# Patient Record
Sex: Female | Born: 1945
Health system: Southern US, Community
[De-identification: ages and names within clinical notes are randomized; demographics above are authoritative.]

## PROBLEM LIST (undated history)

## (undated) DIAGNOSIS — R053 Chronic cough: Secondary | ICD-10-CM

## (undated) DIAGNOSIS — Z973 Presence of spectacles and contact lenses: Secondary | ICD-10-CM

## (undated) DIAGNOSIS — R05 Cough: Secondary | ICD-10-CM

## (undated) DIAGNOSIS — Z789 Other specified health status: Secondary | ICD-10-CM

## (undated) DIAGNOSIS — I10 Essential (primary) hypertension: Secondary | ICD-10-CM

## (undated) HISTORY — PX: TUBAL LIGATION: SHX77

## (undated) HISTORY — PX: COLONOSCOPY: SHX174

---

## 1998-08-24 ENCOUNTER — Ambulatory Visit (HOSPITAL_COMMUNITY): Admission: RE | Admit: 1998-08-24 | Discharge: 1998-08-24 | Payer: Self-pay | Admitting: Obstetrics and Gynecology

## 1998-08-24 ENCOUNTER — Encounter: Payer: Self-pay | Admitting: Obstetrics and Gynecology

## 1999-09-19 ENCOUNTER — Encounter: Payer: Self-pay | Admitting: Obstetrics and Gynecology

## 1999-09-19 ENCOUNTER — Encounter: Admission: RE | Admit: 1999-09-19 | Discharge: 1999-09-19 | Payer: Self-pay | Admitting: Obstetrics and Gynecology

## 1999-09-27 ENCOUNTER — Other Ambulatory Visit: Admission: RE | Admit: 1999-09-27 | Discharge: 1999-09-27 | Payer: Self-pay | Admitting: Obstetrics and Gynecology

## 1999-09-30 ENCOUNTER — Encounter: Payer: Self-pay | Admitting: Obstetrics and Gynecology

## 1999-09-30 ENCOUNTER — Ambulatory Visit (HOSPITAL_COMMUNITY): Admission: RE | Admit: 1999-09-30 | Discharge: 1999-09-30 | Payer: Self-pay | Admitting: Obstetrics and Gynecology

## 2000-06-11 ENCOUNTER — Ambulatory Visit (HOSPITAL_COMMUNITY): Admission: RE | Admit: 2000-06-11 | Discharge: 2000-06-11 | Payer: Self-pay | Admitting: Obstetrics and Gynecology

## 2000-06-11 ENCOUNTER — Encounter: Payer: Self-pay | Admitting: Obstetrics and Gynecology

## 2000-09-21 ENCOUNTER — Encounter: Admission: RE | Admit: 2000-09-21 | Discharge: 2000-09-21 | Payer: Self-pay | Admitting: Obstetrics and Gynecology

## 2000-09-21 ENCOUNTER — Encounter: Payer: Self-pay | Admitting: Obstetrics and Gynecology

## 2001-03-05 ENCOUNTER — Other Ambulatory Visit: Admission: RE | Admit: 2001-03-05 | Discharge: 2001-03-05 | Payer: Self-pay | Admitting: Obstetrics and Gynecology

## 2001-03-08 ENCOUNTER — Ambulatory Visit (HOSPITAL_COMMUNITY): Admission: RE | Admit: 2001-03-08 | Discharge: 2001-03-08 | Payer: Self-pay | Admitting: Obstetrics and Gynecology

## 2001-03-08 ENCOUNTER — Encounter: Payer: Self-pay | Admitting: Obstetrics and Gynecology

## 2001-04-05 ENCOUNTER — Ambulatory Visit (HOSPITAL_COMMUNITY): Admission: RE | Admit: 2001-04-05 | Discharge: 2001-04-05 | Payer: Self-pay | Admitting: Family Medicine

## 2001-04-05 ENCOUNTER — Encounter: Payer: Self-pay | Admitting: Family Medicine

## 2001-04-14 ENCOUNTER — Ambulatory Visit (HOSPITAL_COMMUNITY): Admission: RE | Admit: 2001-04-14 | Discharge: 2001-04-14 | Payer: Self-pay | Admitting: Family Medicine

## 2001-04-14 ENCOUNTER — Encounter (INDEPENDENT_AMBULATORY_CARE_PROVIDER_SITE_OTHER): Payer: Self-pay | Admitting: Specialist

## 2001-04-14 ENCOUNTER — Encounter: Payer: Self-pay | Admitting: Family Medicine

## 2001-09-23 ENCOUNTER — Encounter: Admission: RE | Admit: 2001-09-23 | Discharge: 2001-09-23 | Payer: Self-pay | Admitting: Obstetrics and Gynecology

## 2001-09-23 ENCOUNTER — Encounter: Payer: Self-pay | Admitting: Obstetrics and Gynecology

## 2002-09-26 ENCOUNTER — Encounter: Admission: RE | Admit: 2002-09-26 | Discharge: 2002-09-26 | Payer: Self-pay | Admitting: Obstetrics and Gynecology

## 2002-09-26 ENCOUNTER — Encounter: Payer: Self-pay | Admitting: Obstetrics and Gynecology

## 2003-05-24 ENCOUNTER — Ambulatory Visit (HOSPITAL_COMMUNITY): Admission: RE | Admit: 2003-05-24 | Discharge: 2003-05-24 | Payer: Self-pay | Admitting: Obstetrics and Gynecology

## 2003-05-24 ENCOUNTER — Encounter: Payer: Self-pay | Admitting: Obstetrics and Gynecology

## 2003-10-30 ENCOUNTER — Encounter: Admission: RE | Admit: 2003-10-30 | Discharge: 2003-10-30 | Payer: Self-pay | Admitting: Obstetrics and Gynecology

## 2004-07-07 ENCOUNTER — Emergency Department (HOSPITAL_COMMUNITY): Admission: EM | Admit: 2004-07-07 | Discharge: 2004-07-07 | Payer: Self-pay | Admitting: Emergency Medicine

## 2004-07-12 ENCOUNTER — Ambulatory Visit (HOSPITAL_COMMUNITY): Admission: RE | Admit: 2004-07-12 | Discharge: 2004-07-12 | Payer: Self-pay | Admitting: Obstetrics and Gynecology

## 2004-08-27 ENCOUNTER — Ambulatory Visit: Admission: RE | Admit: 2004-08-27 | Discharge: 2004-08-27 | Payer: Self-pay | Admitting: Obstetrics and Gynecology

## 2004-11-12 ENCOUNTER — Encounter: Admission: RE | Admit: 2004-11-12 | Discharge: 2004-11-12 | Payer: Self-pay | Admitting: Obstetrics and Gynecology

## 2004-11-25 ENCOUNTER — Encounter: Admission: RE | Admit: 2004-11-25 | Discharge: 2004-11-25 | Payer: Self-pay | Admitting: Obstetrics and Gynecology

## 2005-06-11 ENCOUNTER — Encounter: Admission: RE | Admit: 2005-06-11 | Discharge: 2005-06-11 | Payer: Self-pay | Admitting: Obstetrics and Gynecology

## 2005-06-18 ENCOUNTER — Ambulatory Visit (HOSPITAL_COMMUNITY): Admission: RE | Admit: 2005-06-18 | Discharge: 2005-06-18 | Payer: Self-pay | Admitting: Obstetrics and Gynecology

## 2005-09-22 HISTORY — PX: RECTAL TUMOR BY PROCTOTOMY EXCISION: SUR550

## 2006-06-24 ENCOUNTER — Encounter: Admission: RE | Admit: 2006-06-24 | Discharge: 2006-06-24 | Payer: Self-pay | Admitting: Obstetrics and Gynecology

## 2006-07-13 ENCOUNTER — Encounter: Admission: RE | Admit: 2006-07-13 | Discharge: 2006-07-13 | Payer: Self-pay | Admitting: Surgery

## 2006-08-04 ENCOUNTER — Encounter (INDEPENDENT_AMBULATORY_CARE_PROVIDER_SITE_OTHER): Payer: Self-pay | Admitting: Specialist

## 2006-08-04 ENCOUNTER — Ambulatory Visit (HOSPITAL_COMMUNITY): Admission: RE | Admit: 2006-08-04 | Discharge: 2006-08-04 | Payer: Self-pay | Admitting: Surgery

## 2007-08-02 ENCOUNTER — Encounter: Admission: RE | Admit: 2007-08-02 | Discharge: 2007-08-02 | Payer: Self-pay | Admitting: Family Medicine

## 2008-08-07 ENCOUNTER — Encounter: Admission: RE | Admit: 2008-08-07 | Discharge: 2008-08-07 | Payer: Self-pay | Admitting: Family Medicine

## 2009-08-22 ENCOUNTER — Encounter: Admission: RE | Admit: 2009-08-22 | Discharge: 2009-08-22 | Payer: Self-pay | Admitting: Family Medicine

## 2009-08-30 ENCOUNTER — Encounter: Admission: RE | Admit: 2009-08-30 | Discharge: 2009-08-30 | Payer: Self-pay | Admitting: Family Medicine

## 2010-09-02 ENCOUNTER — Encounter
Admission: RE | Admit: 2010-09-02 | Discharge: 2010-09-02 | Payer: Self-pay | Source: Home / Self Care | Attending: Family Medicine | Admitting: Family Medicine

## 2010-10-13 ENCOUNTER — Encounter: Payer: Self-pay | Admitting: Family Medicine

## 2011-02-07 NOTE — Op Note (Signed)
Christie Davis, Christie Davis                ACCOUNT NO.:  [id number]   MEDICAL RECORD NO.:  [id number]          PATIENT TYPE:  AMB   LOCATION:  DAY                          FACILITY:  [facility]   PHYSICIAN:  Sandria Bales. Ezzard Standing, M.D.  DATE OF BIRTH:  Feb 07, 1946   DATE OF PROCEDURE:  08/04/2006  DATE OF DISCHARGE:                                 OPERATIVE REPORT   PREOPERATIVE DIAGNOSIS:  Carcinoid rectal canal.   POSTOPERATIVE DIAGNOSIS:  A 5-mm carcinoid tumor at 7 cm from anal verge  along left rectal wall.   PROCEDURE:  Rigid sigmoidoscopy, rectal ultrasound, transanal resection of  rectal tumor.   SURGEON:  Sandria Bales. Ezzard Standing, M.D.   ANESTHESIA:  General endotracheal.   ESTIMATED BLOOD LOSS:  None.   INDICATIONS FOR PROCEDURE:  Ms. Brodhead is a 65 year old black female who is  a patient of Dr. Laurann Montana who was seen by Dr. Dorena Cookey for routine  colonoscopy. On the colonoscopy, he saw a nodule in the rectal vault which  on biopsy showed to be a carcinoid tumor. She now comes for resection of  this tumor. She had a CT scan preoperatively which showed no other  suspicious mass, lesion, or nodularity.   The risks and benefits were explained to the patient.  The risks include  bleeding, infection and inadequate excision of the tumor.   OPERATIVE NOTE:  Patient in lithotomy position. I first did a digital rectal  exam. I could feel this along the left lateral rectal wall and what I  estimated to about 6 cm in the anal canal.   I then did a rigid sigmoidoscopy, and it is hard to tell whether this thing  is actually of the mucosa or it is submucosal by rigid sigmoidoscopy. There  was no other mass or lesion. I got the sigmoidoscope up to about 20 cm.   I then did a rectal ultrasound. It looks like by rectal ultrasound the  mucosa overlying this nodule is intact, and it looks like the nodule sits in  and disrupts the submucosal plain. So, if it is malignant, it would be  considered a T1  tumor. It does not invade the muscularis propria.   I then removed the rectal ultrasound. The mass ultrasounded at about 7 cm  from the anal verge.   I then used a large anoscope and got up in to visualize it. This thing  really is up about 7 or 8 cm from the anal verge. I placed a 2-0 Chromic  around the mass. Again, the mass is not much bigger than 5 to 6 mm in  diameter. I placed a stitch above the mass. I removed the mass, tried to get  a full thickness incision of the rectal wall, and enclosed that defect with  2 interrupted 2-0 chromic sutures. I then reinspected the area. There was no  bleeding or hematoma at the site of the excision. I then used a piece of  Gelfoam with a nupracainal ointment over it and placed that into the anal  canal.   The patient tolerated the procedure  well.  The patient will be discharged  home today to see me back in 2 weeks for final review of pathology.      Sandria Bales. Ezzard Standing, M.D.  Electronically Signed     DHN/MEDQ  D:  08/04/2006  T:  08/04/2006  Job:  [id number]   cc:   Everardo All. Madilyn Fireman, M.D.  Fax: [phone]   Stacie Acres. Cliffton Asters, M.D.  Fax: [phone]

## 2011-09-24 ENCOUNTER — Other Ambulatory Visit: Payer: Self-pay | Admitting: Family Medicine

## 2011-09-24 DIAGNOSIS — Z139 Encounter for screening, unspecified: Secondary | ICD-10-CM

## 2011-10-03 ENCOUNTER — Ambulatory Visit
Admission: RE | Admit: 2011-10-03 | Discharge: 2011-10-03 | Disposition: A | Discharge status: Home / Self Care | Payer: Federal, State, Local not specified - PPO | Source: Ambulatory Visit | Attending: Family Medicine | Admitting: Family Medicine

## 2011-10-03 DIAGNOSIS — Z139 Encounter for screening, unspecified: Secondary | ICD-10-CM

## 2012-11-15 ENCOUNTER — Other Ambulatory Visit: Payer: Self-pay | Admitting: Family Medicine

## 2012-11-15 DIAGNOSIS — Z1231 Encounter for screening mammogram for malignant neoplasm of breast: Secondary | ICD-10-CM

## 2012-11-26 ENCOUNTER — Emergency Department (HOSPITAL_COMMUNITY): Payer: Federal, State, Local not specified - PPO

## 2012-11-26 ENCOUNTER — Emergency Department (HOSPITAL_COMMUNITY)
Admission: EM | Admit: 2012-11-26 | Discharge: 2012-11-27 | Disposition: A | Discharge status: Home / Self Care | Payer: Federal, State, Local not specified - PPO | Attending: Emergency Medicine | Admitting: Emergency Medicine

## 2012-11-26 ENCOUNTER — Encounter (HOSPITAL_COMMUNITY): Payer: Self-pay | Admitting: *Deleted

## 2012-11-26 DIAGNOSIS — W010XXA Fall on same level from slipping, tripping and stumbling without subsequent striking against object, initial encounter: Secondary | ICD-10-CM | POA: Insufficient documentation

## 2012-11-26 DIAGNOSIS — S42291S Other displaced fracture of upper end of right humerus, sequela: Secondary | ICD-10-CM

## 2012-11-26 DIAGNOSIS — Y929 Unspecified place or not applicable: Secondary | ICD-10-CM | POA: Insufficient documentation

## 2012-11-26 DIAGNOSIS — S42293A Other displaced fracture of upper end of unspecified humerus, initial encounter for closed fracture: Secondary | ICD-10-CM | POA: Insufficient documentation

## 2012-11-26 DIAGNOSIS — Y9301 Activity, walking, marching and hiking: Secondary | ICD-10-CM | POA: Insufficient documentation

## 2012-11-26 MED ORDER — ONDANSETRON HCL 4 MG/2ML IJ SOLN
4.0000 mg | Freq: Once | INTRAMUSCULAR | Status: AC
  Administered 2012-11-26: 4 mg via INTRAVENOUS
  Filled 2012-11-26: qty 2

## 2012-11-26 MED ORDER — MORPHINE SULFATE 4 MG/ML IJ SOLN
4.0000 mg | Freq: Once | INTRAMUSCULAR | Status: AC
  Administered 2012-11-26: 4 mg via INTRAVENOUS
  Filled 2012-11-26: qty 1

## 2012-11-26 MED ORDER — OXYCODONE-ACETAMINOPHEN 5-325 MG PO TABS
1.0000 | ORAL_TABLET | Freq: Four times a day (QID) | ORAL | Status: DC | PRN
Start: 2012-11-26 — End: 2012-12-06

## 2012-11-26 MED ORDER — OXYCODONE-ACETAMINOPHEN 5-325 MG PO TABS
1.0000 | ORAL_TABLET | Freq: Once | ORAL | Status: AC
  Administered 2012-11-26: 1 via ORAL
  Filled 2012-11-26: qty 1

## 2012-11-26 NOTE — ED Notes (Signed)
Gave patient ice pack to the right shoulder

## 2012-11-26 NOTE — ED Notes (Signed)
Pt was transported to CT.

## 2012-11-26 NOTE — ED Notes (Signed)
Per EMS: pt coming into house, fell onto right side while trying to take shoes off. Pain 10/10 on right arm/shoulder, shoulder immobilized  increase pain to palpation/movement, no other complaints no LOC, A&Ox4, 200 mcg of fentanyl. Pt now 2/10 pain. Family at bedside. Skin warm and dry, respirations equal and unlabored.

## 2012-11-26 NOTE — ED Provider Notes (Signed)
History     CSN: [id number]  Arrival date & time 11/26/12  2014   First MD Initiated Contact with Patient 11/26/12 2020      Chief Complaint  Patient presents with  . Fall    (Consider location/radiation/quality/duration/timing/severity/associated sxs/prior treatment) Patient is a 67 y.o. female presenting with fall. The history is provided by the patient.  Fall The accident occurred less than 1 hour ago. The fall occurred while walking (slipped while walking in the house on ice and fell on her right side). She fell from a height of 1 to 2 ft. She landed on a hard floor. There was no blood loss. The point of impact was the right shoulder. The pain is present in the right shoulder. The pain is at a severity of 10/10. The pain is severe. She was not ambulatory at the scene. Pertinent negatives include no numbness, no abdominal pain, no nausea, no vomiting, no headaches, no loss of consciousness and no tingling. The symptoms are aggravated by activity, use of the injured limb and pressure on the injury. Treatment on scene includes a c-collar and a backboard. She has tried immobilization (given fentanyl en-route) for the symptoms. The treatment provided moderate relief.    History reviewed. No pertinent past medical history.  History reviewed. No pertinent past surgical history.  History reviewed. No pertinent family history.  History  Substance Use Topics  . Smoking status: Not on file  . Smokeless tobacco: Not on file  . Alcohol Use: Not on file    OB History   Grav Para Term Preterm Abortions TAB SAB Ect Mult Living                  Review of Systems  Gastrointestinal: Negative for nausea, vomiting and abdominal pain.  Neurological: Negative for tingling, loss of consciousness, numbness and headaches.  All other systems reviewed and are negative.    Allergies  Review of patient's allergies indicates no known allergies.  Home Medications   Current Outpatient Rx  Name   Route  Sig  Dispense  Refill  . Vitamin D, Ergocalciferol, (DRISDOL) 50000 UNITS CAPS   Oral   Take 50,000 Units by mouth every 7 (seven) days. Take on Mondays           BP 118/66  Pulse 79  Temp(Src) 97.7 F (36.5 C) (Oral)  Resp 20  SpO2 94%  Physical Exam  Nursing note and vitals reviewed. Constitutional: She is oriented to person, place, and time. She appears well-developed and well-nourished. She appears distressed.  HENT:  Head: Normocephalic and atraumatic.  Mouth/Throat: Oropharynx is clear and moist.  Eyes: Conjunctivae and EOM are normal. Pupils are equal, round, and reactive to light.  Neck: Normal range of motion. Neck supple. No spinous process tenderness and no muscular tenderness present.  Cardiovascular: Normal rate, regular rhythm and intact distal pulses.   No murmur heard. Pulmonary/Chest: Effort normal and breath sounds normal. No respiratory distress. She has no wheezes. She has no rales.  Abdominal: Soft. She exhibits no distension. There is no tenderness. There is no rebound and no guarding.  Musculoskeletal: Normal range of motion. She exhibits tenderness. She exhibits no edema.       Right hip: Normal.       Left hip: Normal.       Right knee: She exhibits normal range of motion, no swelling, no ecchymosis, no deformity and no laceration. Tenderness found. Medial joint line and lateral joint line tenderness noted.  Thoracic back: Normal.       Lumbar back: Normal.       Right upper arm: She exhibits tenderness, bony tenderness, swelling and deformity.  2+ radial pulse, normal sensation, and good strength  Neurological: She is alert and oriented to person, place, and time.  Skin: Skin is warm and dry. No rash noted. No erythema.  Psychiatric: She has a normal mood and affect. Her behavior is normal.    ED Course  Procedures (including critical care time)  Labs Reviewed - No data to display Dg Shoulder Right  11/26/2012  *RADIOLOGY REPORT*   Clinical Data: Trauma and pain.  RIGHT SHOULDER - 2+ VIEW  Comparison: Numerous films same date  Findings: Degenerative changes of the right acromioclavicular joint.  Slightly wide field of view images.  A deformity of the proximal humerus which is most consistent with acute fracture. Suspect a component of impaction and comminution.  No dislocation. The humeral head projects minimally posterior to the central glenoid on the scapular view, favored to be projectional.  Minimal imaged right hemithorax within normal limits.  IMPRESSION: Deformity at proximal humerus, suspicious for impacted, possibly comminuted fracture.  Given mild limitations of these plain films, CT may be informative for further characterization.   Original Report Authenticated By: Jeronimo Greaves, M.D.    Dg Humerus Right  11/26/2012  *RADIOLOGY REPORT*  Clinical Data: Trauma and pain.  RIGHT HUMERUS - 2+ VIEW  Comparison: Shoulder films same date  Findings: Deformity of the proximal right humerus, which will be better evaluated on shoulder films.  No distal humerus fracture. No dislocation.  IMPRESSION: Please see shoulder films for further description of proximal right humerus deformity.  No distal humerus abnormality identified.   Original Report Authenticated By: Jeronimo Greaves, M.D.      1. Humeral head fracture, right, sequela       MDM   Patient with a mechanical fall onto her right shoulder while she slipped when trying to take off her shoes. She denies hitting her head or LOC. She has no complaints of pain anywhere also her body. She has significant pain in the proximal humerus. The pain in her elbow forearm or wrist. She is neurovascularly intact with good sensation, radial pulse and movement of the fingers. C-spine was cleared.  Plain films show a deformity at the proximal humerus suspicious for impacted possibly comminuted fracture. Will discuss with orthopedics. Patient given further pain control        Gwyneth Sprout, MD 11/26/12 2325

## 2012-11-27 MED ORDER — OXYCODONE-ACETAMINOPHEN 5-325 MG PO TABS
1.0000 | ORAL_TABLET | Freq: Once | ORAL | Status: AC
  Administered 2012-11-27: 1 via ORAL
  Filled 2012-11-27: qty 1

## 2012-11-30 ENCOUNTER — Encounter (HOSPITAL_BASED_OUTPATIENT_CLINIC_OR_DEPARTMENT_OTHER): Payer: Self-pay | Admitting: *Deleted

## 2012-11-30 ENCOUNTER — Other Ambulatory Visit: Payer: Self-pay | Admitting: Orthopedic Surgery

## 2012-12-06 ENCOUNTER — Ambulatory Visit (HOSPITAL_BASED_OUTPATIENT_CLINIC_OR_DEPARTMENT_OTHER)
Admission: RE | Admit: 2012-12-06 | Discharge: 2012-12-07 | Disposition: A | Discharge status: Home / Self Care | Payer: Federal, State, Local not specified - PPO | Source: Ambulatory Visit | Attending: Orthopedic Surgery | Admitting: Orthopedic Surgery

## 2012-12-06 ENCOUNTER — Encounter (HOSPITAL_BASED_OUTPATIENT_CLINIC_OR_DEPARTMENT_OTHER): Payer: Self-pay

## 2012-12-06 ENCOUNTER — Ambulatory Visit (HOSPITAL_COMMUNITY): Payer: Federal, State, Local not specified - PPO

## 2012-12-06 ENCOUNTER — Encounter (HOSPITAL_BASED_OUTPATIENT_CLINIC_OR_DEPARTMENT_OTHER): Payer: Self-pay | Admitting: Anesthesiology

## 2012-12-06 ENCOUNTER — Ambulatory Visit (HOSPITAL_BASED_OUTPATIENT_CLINIC_OR_DEPARTMENT_OTHER): Payer: Federal, State, Local not specified - PPO | Admitting: Anesthesiology

## 2012-12-06 ENCOUNTER — Encounter (HOSPITAL_BASED_OUTPATIENT_CLINIC_OR_DEPARTMENT_OTHER)
Admission: RE | Disposition: A | Discharge status: Home / Self Care | Payer: Self-pay | Source: Ambulatory Visit | Attending: Orthopedic Surgery

## 2012-12-06 DIAGNOSIS — S42213A Unspecified displaced fracture of surgical neck of unspecified humerus, initial encounter for closed fracture: Secondary | ICD-10-CM | POA: Insufficient documentation

## 2012-12-06 DIAGNOSIS — S42201A Unspecified fracture of upper end of right humerus, initial encounter for closed fracture: Secondary | ICD-10-CM

## 2012-12-06 DIAGNOSIS — Z79899 Other long term (current) drug therapy: Secondary | ICD-10-CM | POA: Insufficient documentation

## 2012-12-06 DIAGNOSIS — W19XXXA Unspecified fall, initial encounter: Secondary | ICD-10-CM | POA: Insufficient documentation

## 2012-12-06 DIAGNOSIS — R072 Precordial pain: Secondary | ICD-10-CM | POA: Insufficient documentation

## 2012-12-06 HISTORY — DX: Presence of spectacles and contact lenses: Z97.3

## 2012-12-06 HISTORY — DX: Other specified health status: Z78.9

## 2012-12-06 HISTORY — DX: Cough: R05

## 2012-12-06 HISTORY — PX: ORIF HUMERUS FRACTURE: SHX2126

## 2012-12-06 HISTORY — DX: Chronic cough: R05.3

## 2012-12-06 LAB — POCT HEMOGLOBIN-HEMACUE: Hemoglobin: 13.2 g/dL (ref 12.0–15.0)

## 2012-12-06 SURGERY — OPEN REDUCTION INTERNAL FIXATION (ORIF) PROXIMAL HUMERUS FRACTURE
Anesthesia: General | Site: Arm Upper | Laterality: Right | Wound class: Clean

## 2012-12-06 MED ORDER — HYDROCODONE-ACETAMINOPHEN 5-325 MG PO TABS
1.0000 | ORAL_TABLET | ORAL | Status: DC | PRN
  Administered 2012-12-07: 2 via ORAL

## 2012-12-06 MED ORDER — POLYETHYLENE GLYCOL 3350 17 G PO PACK: 17.0000 g | PACK | Freq: Every day | ORAL | Status: DC | PRN

## 2012-12-06 MED ORDER — MORPHINE SULFATE 2 MG/ML IJ SOLN: 1.0000 mg | INTRAMUSCULAR | Status: DC | PRN

## 2012-12-06 MED ORDER — ACETAMINOPHEN 325 MG PO TABS: 650.0000 mg | ORAL_TABLET | Freq: Four times a day (QID) | ORAL | Status: DC | PRN

## 2012-12-06 MED ORDER — PROMETHAZINE HCL 25 MG/ML IJ SOLN
6.2500 mg | INTRAMUSCULAR | Status: DC | PRN
  Administered 2012-12-06: 6.25 mg via INTRAVENOUS

## 2012-12-06 MED ORDER — DIPHENHYDRAMINE HCL 12.5 MG/5ML PO ELIX: 12.5000 mg | ORAL_SOLUTION | ORAL | Status: DC | PRN

## 2012-12-06 MED ORDER — OXYCODONE-ACETAMINOPHEN 5-325 MG PO TABS: 1.0000 | ORAL_TABLET | ORAL | Status: DC | PRN

## 2012-12-06 MED ORDER — CEFAZOLIN SODIUM-DEXTROSE 2-3 GM-% IV SOLR
2.0000 g | Freq: Four times a day (QID) | INTRAVENOUS | Status: AC
  Administered 2012-12-06 – 2012-12-07 (×3): 2 g via INTRAVENOUS

## 2012-12-06 MED ORDER — METHOCARBAMOL 100 MG/ML IJ SOLN: 500.0000 mg | Freq: Four times a day (QID) | INTRAVENOUS | Status: DC | PRN

## 2012-12-06 MED ORDER — ZOLPIDEM TARTRATE 5 MG PO TABS: 5.0000 mg | ORAL_TABLET | Freq: Every evening | ORAL | Status: DC | PRN

## 2012-12-06 MED ORDER — FENTANYL CITRATE 0.05 MG/ML IJ SOLN
INTRAMUSCULAR | Status: DC | PRN
  Administered 2012-12-06: 25 ug via INTRAVENOUS
  Administered 2012-12-06: 50 ug via INTRAVENOUS
  Administered 2012-12-06 (×3): 25 ug via INTRAVENOUS

## 2012-12-06 MED ORDER — ACETAMINOPHEN 650 MG RE SUPP: 650.0000 mg | Freq: Four times a day (QID) | RECTAL | Status: DC | PRN

## 2012-12-06 MED ORDER — MIDAZOLAM HCL 2 MG/2ML IJ SOLN
1.0000 mg | INTRAMUSCULAR | Status: DC | PRN
  Administered 2012-12-06: 2 mg via INTRAVENOUS

## 2012-12-06 MED ORDER — METOCLOPRAMIDE HCL 5 MG/ML IJ SOLN: 5.0000 mg | Freq: Three times a day (TID) | INTRAMUSCULAR | Status: DC | PRN

## 2012-12-06 MED ORDER — HYDROMORPHONE HCL PF 1 MG/ML IJ SOLN: 0.2500 mg | INTRAMUSCULAR | Status: DC | PRN

## 2012-12-06 MED ORDER — LACTATED RINGERS IV SOLN
INTRAVENOUS | Status: DC
  Administered 2012-12-06 (×2): via INTRAVENOUS

## 2012-12-06 MED ORDER — BISACODYL 5 MG PO TBEC: 5.0000 mg | DELAYED_RELEASE_TABLET | Freq: Every day | ORAL | Status: DC | PRN

## 2012-12-06 MED ORDER — MEPERIDINE HCL 25 MG/ML IJ SOLN: 6.2500 mg | INTRAMUSCULAR | Status: DC | PRN

## 2012-12-06 MED ORDER — 0.9 % SODIUM CHLORIDE (POUR BTL) OPTIME
TOPICAL | Status: DC | PRN
  Administered 2012-12-06: 1000 mL

## 2012-12-06 MED ORDER — PROPOFOL 10 MG/ML IV BOLUS
INTRAVENOUS | Status: DC | PRN
  Administered 2012-12-06: 150 mg via INTRAVENOUS

## 2012-12-06 MED ORDER — ALUM & MAG HYDROXIDE-SIMETH 200-200-20 MG/5ML PO SUSP: 30.0000 mL | ORAL | Status: DC | PRN

## 2012-12-06 MED ORDER — SUCCINYLCHOLINE CHLORIDE 20 MG/ML IJ SOLN
INTRAMUSCULAR | Status: DC | PRN
  Administered 2012-12-06: 100 mg via INTRAVENOUS

## 2012-12-06 MED ORDER — DOCUSATE SODIUM 100 MG PO CAPS: 100.0000 mg | ORAL_CAPSULE | Freq: Two times a day (BID) | ORAL | Status: DC

## 2012-12-06 MED ORDER — ASPIRIN EC 325 MG PO TBEC: 325.0000 mg | DELAYED_RELEASE_TABLET | Freq: Two times a day (BID) | ORAL | Status: DC

## 2012-12-06 MED ORDER — PHENOL 1.4 % MT LIQD: 1.0000 | OROMUCOSAL | Status: DC | PRN

## 2012-12-06 MED ORDER — FENTANYL CITRATE 0.05 MG/ML IJ SOLN
50.0000 ug | INTRAMUSCULAR | Status: DC | PRN
  Administered 2012-12-06: 100 ug via INTRAVENOUS

## 2012-12-06 MED ORDER — KCL IN DEXTROSE-NACL 20-5-0.45 MEQ/L-%-% IV SOLN
INTRAVENOUS | Status: DC
  Administered 2012-12-06: 18:00:00 via INTRAVENOUS

## 2012-12-06 MED ORDER — ONDANSETRON HCL 4 MG/2ML IJ SOLN
INTRAMUSCULAR | Status: DC | PRN
  Administered 2012-12-06: 4 mg via INTRAVENOUS

## 2012-12-06 MED ORDER — MIDAZOLAM HCL 2 MG/2ML IJ SOLN: 0.5000 mg | Freq: Once | INTRAMUSCULAR | Status: DC | PRN

## 2012-12-06 MED ORDER — MENTHOL 3 MG MT LOZG: 1.0000 | LOZENGE | OROMUCOSAL | Status: DC | PRN

## 2012-12-06 MED ORDER — ONDANSETRON HCL 4 MG/2ML IJ SOLN: 4.0000 mg | Freq: Four times a day (QID) | INTRAMUSCULAR | Status: DC | PRN

## 2012-12-06 MED ORDER — OXYCODONE HCL 5 MG/5ML PO SOLN: 5.0000 mg | Freq: Once | ORAL | Status: DC | PRN

## 2012-12-06 MED ORDER — OXYCODONE HCL 5 MG PO TABS: 5.0000 mg | ORAL_TABLET | Freq: Once | ORAL | Status: DC | PRN

## 2012-12-06 MED ORDER — BUPIVACAINE-EPINEPHRINE PF 0.5-1:200000 % IJ SOLN
INTRAMUSCULAR | Status: DC | PRN
  Administered 2012-12-06: 30 mL

## 2012-12-06 MED ORDER — METOCLOPRAMIDE HCL 5 MG PO TABS: 5.0000 mg | ORAL_TABLET | Freq: Three times a day (TID) | ORAL | Status: DC | PRN

## 2012-12-06 MED ORDER — ACETAMINOPHEN 10 MG/ML IV SOLN
1000.0000 mg | Freq: Once | INTRAVENOUS | Status: AC
  Administered 2012-12-06: 1000 mg via INTRAVENOUS

## 2012-12-06 MED ORDER — DEXAMETHASONE SODIUM PHOSPHATE 4 MG/ML IJ SOLN
INTRAMUSCULAR | Status: DC | PRN
  Administered 2012-12-06: 10 mg via INTRAVENOUS

## 2012-12-06 MED ORDER — OXYCODONE HCL 5 MG PO TABS: 5.0000 mg | ORAL_TABLET | ORAL | Status: DC | PRN

## 2012-12-06 MED ORDER — ONDANSETRON HCL 4 MG PO TABS: 4.0000 mg | ORAL_TABLET | Freq: Four times a day (QID) | ORAL | Status: DC | PRN

## 2012-12-06 MED ORDER — METHOCARBAMOL 500 MG PO TABS
500.0000 mg | ORAL_TABLET | Freq: Four times a day (QID) | ORAL | Status: DC | PRN
  Administered 2012-12-06 – 2012-12-07 (×2): 500 mg via ORAL

## 2012-12-06 MED ORDER — FLEET ENEMA 7-19 GM/118ML RE ENEM: 1.0000 | ENEMA | Freq: Once | RECTAL | Status: AC | PRN

## 2012-12-06 SURGICAL SUPPLY — 76 items
BENZOIN TINCTURE PRP APPL 2/3 (GAUZE/BANDAGES/DRESSINGS) ×2 IMPLANT
BIT DRILL 2.8X4 QC CORT (BIT) ×4 IMPLANT
BIT DRILL 4 LONG FAST STEP (BIT) ×2 IMPLANT
BIT DRILL 4 SHORT FAST STEP (BIT) ×2 IMPLANT
BLADE SURG 15 STRL LF DISP TIS (BLADE) ×2 IMPLANT
BLADE SURG 15 STRL SS (BLADE) ×2
CANISTER SUCTION 2500CC (MISCELLANEOUS) ×2 IMPLANT
CHLORAPREP W/TINT 26ML (MISCELLANEOUS) ×2 IMPLANT
CLOTH BEACON ORANGE TIMEOUT ST (SAFETY) ×2 IMPLANT
DRAPE C-ARM 42X72 X-RAY (DRAPES) ×2 IMPLANT
DRAPE INCISE IOBAN 66X45 STRL (DRAPES) ×4 IMPLANT
DRAPE SURG 17X23 STRL (DRAPES) ×2 IMPLANT
DRAPE U 20/CS (DRAPES) ×2 IMPLANT
DRAPE U-SHAPE 47X51 STRL (DRAPES) ×2 IMPLANT
DRAPE U-SHAPE 76X120 STRL (DRAPES) ×4 IMPLANT
DRSG PAD ABDOMINAL 8X10 ST (GAUZE/BANDAGES/DRESSINGS) ×2 IMPLANT
ELECT BLADE 6.5 .24CM SHAFT (ELECTRODE) IMPLANT
ELECT REM PT RETURN 9FT ADLT (ELECTROSURGICAL) ×2
ELECTRODE REM PT RTRN 9FT ADLT (ELECTROSURGICAL) ×1 IMPLANT
GAUZE SPONGE 4X4 16PLY XRAY LF (GAUZE/BANDAGES/DRESSINGS) IMPLANT
GLOVE BIO SURGEON STRL SZ7 (GLOVE) ×2 IMPLANT
GLOVE BIO SURGEON STRL SZ7.5 (GLOVE) ×2 IMPLANT
GLOVE BIOGEL M STRL SZ7.5 (GLOVE) ×2 IMPLANT
GLOVE BIOGEL PI IND STRL 7.0 (GLOVE) ×1 IMPLANT
GLOVE BIOGEL PI IND STRL 8 (GLOVE) ×2 IMPLANT
GLOVE BIOGEL PI INDICATOR 7.0 (GLOVE) ×1
GLOVE BIOGEL PI INDICATOR 8 (GLOVE) ×2
GOWN PREVENTION PLUS XLARGE (GOWN DISPOSABLE) ×4 IMPLANT
GOWN PREVENTION PLUS XXLARGE (GOWN DISPOSABLE) ×6 IMPLANT
NDL SUT 6 .5 CRC .975X.05 MAYO (NEEDLE) ×1 IMPLANT
NEEDLE MAYO TAPER (NEEDLE) ×1
NS IRRIG 1000ML POUR BTL (IV SOLUTION) ×2 IMPLANT
PACK ARTHROSCOPY DSU (CUSTOM PROCEDURE TRAY) ×2 IMPLANT
PACK BASIN DAY SURGERY FS (CUSTOM PROCEDURE TRAY) ×2 IMPLANT
PEG STND 4.0X30MM (Orthopedic Implant) ×2 IMPLANT
PEG STND 4.0X32.5MM (Orthopedic Implant) ×2 IMPLANT
PEG STND 4.0X35MM (Orthopedic Implant) ×2 IMPLANT
PEG STND 4.0X37.5MM (Orthopedic Implant) ×2 IMPLANT
PEG STND 4.0X40MM (Orthopedic Implant) ×2 IMPLANT
PEG STND 4.0X45.0MM (Orthopedic Implant) ×2 IMPLANT
PEGSTD 4.0X30MM (Orthopedic Implant) ×1 IMPLANT
PEGSTD 4.0X32.5MM (Orthopedic Implant) ×1 IMPLANT
PEGSTD 4.0X35MM (Orthopedic Implant) ×1 IMPLANT
PEGSTD 4.0X37.5MM (Orthopedic Implant) ×1 IMPLANT
PEGSTD 4.0X40MM (Orthopedic Implant) ×1 IMPLANT
PEGSTD 4.0X45.0MM (Orthopedic Implant) ×1 IMPLANT
PENCIL BUTTON HOLSTER BLD 10FT (ELECTRODE) ×2 IMPLANT
PLATE SHOULDER S3 3HOLE RT (Plate) ×2 IMPLANT
SCREW LOCK 90D ANGLED 3.8X24 (Screw) ×4 IMPLANT
SCREW MULTIDIR 3.8X24 HUMRL (Screw) ×2 IMPLANT
SHEET MEDIUM DRAPE 40X70 STRL (DRAPES) IMPLANT
SLEEVE SCD COMPRESS KNEE MED (MISCELLANEOUS) ×2 IMPLANT
SLING ARM FOAM STRAP LRG (SOFTGOODS) ×2 IMPLANT
SLING ARM FOAM STRAP MED (SOFTGOODS) IMPLANT
SLING ARM IMMOBILIZER MED (SOFTGOODS) IMPLANT
SPONGE GAUZE 4X4 12PLY (GAUZE/BANDAGES/DRESSINGS) ×2 IMPLANT
SPONGE LAP 4X18 X RAY DECT (DISPOSABLE) ×2 IMPLANT
STRIP CLOSURE SKIN 1/2X4 (GAUZE/BANDAGES/DRESSINGS) ×2 IMPLANT
SUCTION FRAZIER TIP 10 FR DISP (SUCTIONS) ×2 IMPLANT
SUPPORT WRAP ARM LG (MISCELLANEOUS) ×2 IMPLANT
SUT ETHILON 4 0 PS 2 18 (SUTURE) IMPLANT
SUT FIBERWIRE #2 38 T-5 BLUE (SUTURE) ×8
SUT MNCRL AB 3-0 PS2 18 (SUTURE) IMPLANT
SUT MNCRL AB 4-0 PS2 18 (SUTURE) ×2 IMPLANT
SUT PDS AB 0 CT 36 (SUTURE) IMPLANT
SUT PROLENE 3 0 PS 2 (SUTURE) IMPLANT
SUT VIC AB 0 CT1 18XCR BRD 8 (SUTURE) ×1 IMPLANT
SUT VIC AB 0 CT1 8-18 (SUTURE) ×1
SUT VIC AB 2-0 CT1 27 (SUTURE)
SUT VIC AB 2-0 CT1 TAPERPNT 27 (SUTURE) IMPLANT
SUT VIC AB 2-0 SH 18 (SUTURE) IMPLANT
SUTURE FIBERWR #2 38 T-5 BLUE (SUTURE) ×4 IMPLANT
SYR BULB 3OZ (MISCELLANEOUS) ×2 IMPLANT
TOWEL OR 17X24 6PK STRL BLUE (TOWEL DISPOSABLE) ×2 IMPLANT
TOWEL OR NON WOVEN STRL DISP B (DISPOSABLE) ×2 IMPLANT
YANKAUER SUCT BULB TIP NO VENT (SUCTIONS) ×2 IMPLANT

## 2012-12-06 NOTE — Anesthesia Postprocedure Evaluation (Signed)
  Anesthesia Post-op Note  Patient: Christie Davis  Procedure(s) Performed: Procedure(s): OPEN REDUCTION INTERNAL FIXATION (ORIF) PROXIMAL HUMERUS FRACTURE (Right)  Patient Location: PACU  Anesthesia Type:GA combined with regional for post-op pain  Level of Consciousness: awake, alert , oriented and patient cooperative  Airway and Oxygen Therapy: Patient Spontanous Breathing  Post-op Pain: none  Post-op Assessment: Post-op Vital signs reviewed, Patient's Cardiovascular Status Stable, Respiratory Function Stable, Patent Airway, No signs of Nausea or vomiting and Pain level controlled  Post-op Vital Signs: Reviewed and stable  Complications: No apparent anesthesia complications

## 2012-12-06 NOTE — Anesthesia Preprocedure Evaluation (Signed)
Anesthesia Evaluation  Patient identified by MRN, date of birth, ID band Patient awake    Reviewed: Allergy & Precautions, H&P , NPO status , Patient's Chart, lab work & pertinent test results  History of Anesthesia Complications Negative for: history of anesthetic complications  Airway Mallampati: I TM Distance: >3 FB Neck ROM: Full    Dental  (+) Teeth Intact and Dental Advisory Given   Pulmonary neg pulmonary ROS,  breath sounds clear to auscultation  Pulmonary exam normal       Cardiovascular negative cardio ROS  Rhythm:Regular Rate:Normal     Neuro/Psych negative neurological ROS  negative psych ROS   GI/Hepatic negative GI ROS, Neg liver ROS,   Endo/Other  Morbid obesity  Renal/GU negative Renal ROS     Musculoskeletal   Abdominal (+) + obese,   Peds  Hematology negative hematology ROS (+)   Anesthesia Other Findings   Reproductive/Obstetrics                           Anesthesia Physical Anesthesia Plan  ASA: II  Anesthesia Plan: General   Post-op Pain Management:    Induction: Intravenous  Airway Management Planned: Oral ETT  Additional Equipment:   Intra-op Plan:   Post-operative Plan: Extubation in OR  Informed Consent: I have reviewed the patients History and Physical, chart, labs and discussed the procedure including the risks, benefits and alternatives for the proposed anesthesia with the patient or authorized representative who has indicated his/her understanding and acceptance.   Dental advisory given  Plan Discussed with: CRNA and Surgeon  Anesthesia Plan Comments: (Plan routine monitors, GETA with interscalene block for post op analgesia)        Anesthesia Quick Evaluation

## 2012-12-06 NOTE — Op Note (Signed)
Procedure(s): OPEN REDUCTION INTERNAL FIXATION (ORIF) PROXIMAL HUMERUS FRACTURE Procedure Note  Christie Davis female 67 y.o. 12/06/2012  Procedure(s) and Anesthesia Type:    * RIGHT OPEN REDUCTION INTERNAL FIXATION (ORIF) PROXIMAL HUMERUS FRACTURE - General  Postoperative diagnosis: Displaced right 2 part proximal humerus fracture  Surgeon(s) and Role:    * Mable Paris, MD - Primary        Surgeon: Mable Paris   Assistants: Damita Lack PA-C [facility] was present and scrubbed throughout the procedure and was essential in positioning, retraction, exposure, and closure)  Anesthesia: General endotracheal anesthesia with preoperative interscalene block    Procedure Detail  OPEN REDUCTION INTERNAL FIXATION (ORIF) PROXIMAL HUMERUS FRACTURE  Findings: Near-anatomic reduction using a Biomet S3 proximal humerus locking plate. Four  #2 FiberWire is were used through the rotator cuff for additional suture fixation  Estimated Blood Loss:  100 mL         Drains: none  Blood Given: none         Specimens: none        Complications:  * No complications entered in OR log *         Disposition: PACU - hemodynamically stable.         Condition: stable    Procedure:   INDICATIONS FOR SURGERY: The patient suffered a displaced two-part proximal humerus fracture and is  indicated for surgical treatment to promote anatomic alignment, prevent nonunion, and prevent deformity and subsequent weakness and disability. We have had an extensive discussion of risks, benefits, and alternatives of procedure including, but not limited to  risk of bleeding, infection, damage to neurovascular structures, risk of  nonunion, malunion, potential need for future hardware removal or  possible revision surgery. The patient understands this and wishes to proceed with surgery.    DESCRIPTION OF PROCEDURE: The patient was identified in preoperative  holding area where I  personally marked the operative site after  verifying site, side, and procedure with the patient. The patient was taken back  to the operating room where general anesthesia was induced without  complication and was placed in the beach-chair position with the back  elevated about 40 degrees and all extremities carefully padded and  positioned. The right upper extremity was then prepped and  draped in a standard sterile fashion. The appropriate time-out  procedure was carried out. The patient did receive IV antibiotics  within 30 minutes of incision.  An approximately 12 cm incision was made from the coracoid tip to the Center of the humerus at the level of the axilla. Dissection was carried down to subcutaneous tissues to the level of the cephalic vein identifying the deltopectoral interval. The cephalic vein was taken laterally with the deltoid and the pectoralis major was taken medially. The biceps tendon was identified and traced into the bicipital groove. It was then tenotomized and tenodesed to the upper border of the pectoralis major. A retractor was placed under the conjoined tendon medially and under the deltoid laterally. A #2 FiberWire was placed at the bone tendon junction of the subscapularis and lesser tuberosity for control and then 3 additional #2 FiberWire is were placed along the posterior and superior rotator cuff/tuberosity interface for additional control. Using the sutures I was able to bring the head out of posterior tilt and varus angulation. Using a Cobb elevator to apply gentle distal pressure the reduction was achieved. A small 3 hole S3 plate was placed laterally and a K wire was advanced to hold  in place. Fluoroscopic imaging was then used to verify reduction and plate placement. The oblong distal nonlocking shaft screw was placed first. Proximally 5 locking pegs were placed by first drilling, and drilling, measuring and placing the appropriate size smooth pegs. The hand drill did  penetrate on the most inferior Pegg and x-ray guidance was used to make sure that the appropriate length was placed. Once the proximal locking pegs were all placed the distal nonlocking screws were placed and the shaft. Final fluoroscopic imaging in all planes including live fluoroscopy showed no penetration of the pegs and appropriate position of the plate with near-anatomic alignment at the fracture. The wound was then copiously irrigated with normal saline and closed in layers with 2-0 Vicryl in a deep dermal layer and 4-0 Monocryl for skin closure. Steri-Strips were applied. No drain was placed. A light sterile dressing was applied. The patient was allowed to awaken from general anesthesia placed in a sling, transferred to the stretcher and taken to the recovery room in stable condition.  POSTOPERATIVE PLAN: she will be kept overnight for pain control and  antibiotics, and will likely be discharged in the morning in a sling.

## 2012-12-06 NOTE — Anesthesia Procedure Notes (Addendum)
Anesthesia Regional Block:  Interscalene brachial plexus block  Pre-Anesthetic Checklist: ,, timeout performed, Correct Patient, Correct Site, Correct Laterality, Correct Procedure, Correct Position, site marked, Risks and benefits discussed,  Surgical consent,  Pre-op evaluation,  At surgeon's request and post-op pain management  Laterality: Right  Prep: chloraprep       Needles:  Injection technique: Single-shot  Needle Type: Stimulator Needle - 40     Needle Length: 4cm  Needle Gauge: 22 and 22 G    Additional Needles:  Procedures: nerve stimulator Interscalene brachial plexus block  Nerve Stimulator or Paresthesia:  Response: forearm twitch , 0.45 mA, 0.1 ms,   Additional Responses:   Narrative:  Start time: 12/06/2012 1:40 PM End time: 12/06/2012 1:49 PM Injection made incrementally with aspirations every 5 mL.  Performed by: Personally  Anesthesiologist: Sandford Craze, MD  Additional Notes: Pt identified in Holding room.  Monitors applied. Working IV access confirmed. Sterile prep R neck.  #22ga PNS to forearm twitch at 0.37mA threshold.  30cc 0.5% Bupivacaine with 1:200k epi injected incrementally after negative test dose.  Patient asymptomatic, VSS, no heme aspirated, tolerated well.  Sandford Craze, MD  Interscalene brachial plexus block Procedure Name: Intubation Date/Time: 12/06/2012 2:13 PM Performed by: Gar Gibbon Pre-anesthesia Checklist: Patient identified, Emergency Drugs available, Suction available and Patient being monitored Patient Re-evaluated:Patient Re-evaluated prior to inductionOxygen Delivery Method: Circle System Utilized Preoxygenation: Pre-oxygenation with 100% oxygen Intubation Type: IV induction Ventilation: Mask ventilation without difficulty Laryngoscope Size: Miller and 3 Grade View: Grade III Tube type: Oral Tube size: 7.0 mm Number of attempts: 1 Airway Equipment and Method: stylet and oral airway Placement Confirmation: ETT inserted  through vocal cords under direct vision,  positive ETCO2 and breath sounds checked- equal and bilateral Secured at: 2 cm Tube secured with: Tape Dental Injury: Teeth and Oropharynx as per pre-operative assessment

## 2012-12-06 NOTE — Transfer of Care (Signed)
Immediate Anesthesia Transfer of Care Note  Patient: Christie Davis  Procedure(s) Performed: Procedure(s): OPEN REDUCTION INTERNAL FIXATION (ORIF) PROXIMAL HUMERUS FRACTURE (Right)  Patient Location: PACU  Anesthesia Type:GA combined with regional for post-op pain  Level of Consciousness: sedated and patient cooperative  Airway & Oxygen Therapy: Patient Spontanous Breathing and Patient connected to face mask oxygen  Post-op Assessment: Report given to PACU RN and Post -op Vital signs reviewed and stable  Post vital signs: Reviewed and stable  Complications: No apparent anesthesia complications

## 2012-12-06 NOTE — Progress Notes (Signed)
Patient c/o substernal chest pain when she swallows. VSS, EKG unchanged. indigestive symptoms/chest pain totally relieved with sips of ginger ale.  Patient awake and alert, comfortable, VSS.  OK for discharge to [facility]    Sandford Craze, MD

## 2012-12-06 NOTE — Progress Notes (Signed)
Assisted Dr. Jackson with right, interscalene  block. Side rails up, monitors on throughout procedure. See vital signs in flow sheet. Tolerated Procedure well. 

## 2012-12-06 NOTE — H&P (Signed)
Christie Davis is an 67 y.o. female.   Chief Complaint: R shoulder injury HPI: s/p fall with R prox humerus fx  Past Medical History  Diagnosis Date  . Chronic cough     off and on-  . Wears glasses   . Medical history non-contributory     Past Surgical History  Procedure Laterality Date  . Rectal tumor by proctotomy excision  2007  . Tubal ligation    . Colonoscopy      History reviewed. No pertinent family history. Social History:  reports that she has never smoked. She does not have any smokeless tobacco history on file. She reports that she does not drink alcohol or use illicit drugs.  Allergies: No Known Allergies  Medications Prior to Admission  Medication Sig Dispense Refill  . oxyCODONE-acetaminophen (PERCOCET/ROXICET) 5-325 MG per tablet Take 1-2 tablets by mouth every 6 (six) hours as needed for pain.  25 tablet  0  . Vitamin D, Ergocalciferol, (DRISDOL) 50000 UNITS CAPS Take 50,000 Units by mouth every 7 (seven) days. Take on Mondays        No results found for this or any previous visit (from the past 48 hour(s)). No results found.  Review of Systems  All other systems reviewed and are negative.    Blood pressure 146/89, pulse 88, temperature 98.1 F (36.7 C), temperature source Oral, resp. rate 20, height 5\' 3"  (1.6 m), weight 83.915 kg (185 lb), SpO2 100.00%. Physical Exam  Constitutional: She is oriented to person, place, and time. She appears well-developed and well-nourished.  HENT:  Head: Atraumatic.  Eyes: EOM are normal.  Cardiovascular: Intact distal pulses.   Respiratory: Effort normal.  Musculoskeletal:       Right shoulder: She exhibits decreased range of motion, tenderness, swelling and pain.  Neurological: She is alert and oriented to person, place, and time.  Skin: Skin is warm and dry.  Psychiatric: She has a normal mood and affect.     Assessment/Plan Displaced R proximal humerus fracture Plan ORIF Risks / benefits of surgery  discussed Consent on chart  NPO for OR Preop antibiotics   Jerrad Mendibles WILLIAM 12/06/2012, 12:17 PM

## 2012-12-07 NOTE — Progress Notes (Signed)
PATIENT ID: Christie Davis   1 Day Post-Op Procedure(s) (LRB): OPEN REDUCTION INTERNAL FIXATION (ORIF) PROXIMAL HUMERUS FRACTURE (Right)  Subjective: Slept well overnight. Pain is well controlled. No complaints or concerns. Ready to go home.   Objective:  Filed Vitals:   12/07/12 0630  BP: 129/75  Pulse: 76  Temp: 98 F (36.7 C)  Resp: 16     Awake, alert orientated Dressing c/d/i Sling in place Wiggles fingers Distally NVI  Labs:   Recent Labs  12/06/12 1232  HGB 13.2  No results found for this basename: WBC, RBC, HCT, PLT,  in the last 72 hoursNo results found for this basename: NA, K, CL, CO2, BUN, CREATININE, GLUCOSE, CALCIUM,  in the last 72 hours  Assessment and Plan: Day 1 s/p ORIF prox humeru fx Pain well controlled D/c today to home with Percocet for pain control, script in chart F/u Dr. Ave Filter in 1 week  VTE proph: ASA 325mg  BID, SCDs

## 2012-12-09 ENCOUNTER — Encounter (HOSPITAL_BASED_OUTPATIENT_CLINIC_OR_DEPARTMENT_OTHER): Payer: Self-pay | Admitting: Orthopedic Surgery

## 2012-12-13 ENCOUNTER — Ambulatory Visit: Payer: Federal, State, Local not specified - PPO

## 2012-12-22 DIAGNOSIS — M25519 Pain in unspecified shoulder: Secondary | ICD-10-CM | POA: Diagnosis not present

## 2012-12-22 DIAGNOSIS — S42293A Other displaced fracture of upper end of unspecified humerus, initial encounter for closed fracture: Secondary | ICD-10-CM | POA: Diagnosis not present

## 2012-12-23 DIAGNOSIS — M25519 Pain in unspecified shoulder: Secondary | ICD-10-CM | POA: Diagnosis not present

## 2012-12-23 DIAGNOSIS — S42293A Other displaced fracture of upper end of unspecified humerus, initial encounter for closed fracture: Secondary | ICD-10-CM | POA: Diagnosis not present

## 2012-12-27 DIAGNOSIS — S42293A Other displaced fracture of upper end of unspecified humerus, initial encounter for closed fracture: Secondary | ICD-10-CM | POA: Diagnosis not present

## 2012-12-27 DIAGNOSIS — M25519 Pain in unspecified shoulder: Secondary | ICD-10-CM | POA: Diagnosis not present

## 2012-12-29 DIAGNOSIS — S42293A Other displaced fracture of upper end of unspecified humerus, initial encounter for closed fracture: Secondary | ICD-10-CM | POA: Diagnosis not present

## 2012-12-30 DIAGNOSIS — S42293A Other displaced fracture of upper end of unspecified humerus, initial encounter for closed fracture: Secondary | ICD-10-CM | POA: Diagnosis not present

## 2013-01-04 DIAGNOSIS — S42293A Other displaced fracture of upper end of unspecified humerus, initial encounter for closed fracture: Secondary | ICD-10-CM | POA: Diagnosis not present

## 2013-01-05 DIAGNOSIS — S42293A Other displaced fracture of upper end of unspecified humerus, initial encounter for closed fracture: Secondary | ICD-10-CM | POA: Diagnosis not present

## 2013-01-06 DIAGNOSIS — S42293A Other displaced fracture of upper end of unspecified humerus, initial encounter for closed fracture: Secondary | ICD-10-CM | POA: Diagnosis not present

## 2013-01-06 DIAGNOSIS — M25519 Pain in unspecified shoulder: Secondary | ICD-10-CM | POA: Diagnosis not present

## 2013-01-12 DIAGNOSIS — M25519 Pain in unspecified shoulder: Secondary | ICD-10-CM | POA: Diagnosis not present

## 2013-01-14 ENCOUNTER — Ambulatory Visit: Payer: Federal, State, Local not specified - PPO

## 2013-01-17 DIAGNOSIS — M25519 Pain in unspecified shoulder: Secondary | ICD-10-CM | POA: Diagnosis not present

## 2013-01-17 DIAGNOSIS — S42293A Other displaced fracture of upper end of unspecified humerus, initial encounter for closed fracture: Secondary | ICD-10-CM | POA: Diagnosis not present

## 2013-01-19 DIAGNOSIS — S42293A Other displaced fracture of upper end of unspecified humerus, initial encounter for closed fracture: Secondary | ICD-10-CM | POA: Diagnosis not present

## 2013-01-19 DIAGNOSIS — M25519 Pain in unspecified shoulder: Secondary | ICD-10-CM | POA: Diagnosis not present

## 2013-01-24 DIAGNOSIS — M25519 Pain in unspecified shoulder: Secondary | ICD-10-CM | POA: Diagnosis not present

## 2013-01-24 DIAGNOSIS — S42293A Other displaced fracture of upper end of unspecified humerus, initial encounter for closed fracture: Secondary | ICD-10-CM | POA: Diagnosis not present

## 2013-01-26 DIAGNOSIS — M25519 Pain in unspecified shoulder: Secondary | ICD-10-CM | POA: Diagnosis not present

## 2013-01-26 DIAGNOSIS — S42293A Other displaced fracture of upper end of unspecified humerus, initial encounter for closed fracture: Secondary | ICD-10-CM | POA: Diagnosis not present

## 2013-01-31 DIAGNOSIS — M25519 Pain in unspecified shoulder: Secondary | ICD-10-CM | POA: Diagnosis not present

## 2013-01-31 DIAGNOSIS — S42293A Other displaced fracture of upper end of unspecified humerus, initial encounter for closed fracture: Secondary | ICD-10-CM | POA: Diagnosis not present

## 2013-02-02 DIAGNOSIS — M25519 Pain in unspecified shoulder: Secondary | ICD-10-CM | POA: Diagnosis not present

## 2013-02-02 DIAGNOSIS — S42293A Other displaced fracture of upper end of unspecified humerus, initial encounter for closed fracture: Secondary | ICD-10-CM | POA: Diagnosis not present

## 2013-02-07 DIAGNOSIS — S42293A Other displaced fracture of upper end of unspecified humerus, initial encounter for closed fracture: Secondary | ICD-10-CM | POA: Diagnosis not present

## 2013-02-07 DIAGNOSIS — M25519 Pain in unspecified shoulder: Secondary | ICD-10-CM | POA: Diagnosis not present

## 2013-02-09 DIAGNOSIS — M25519 Pain in unspecified shoulder: Secondary | ICD-10-CM | POA: Diagnosis not present

## 2013-02-09 DIAGNOSIS — S42293A Other displaced fracture of upper end of unspecified humerus, initial encounter for closed fracture: Secondary | ICD-10-CM | POA: Diagnosis not present

## 2013-02-16 DIAGNOSIS — M25519 Pain in unspecified shoulder: Secondary | ICD-10-CM | POA: Diagnosis not present

## 2013-02-16 DIAGNOSIS — S42293A Other displaced fracture of upper end of unspecified humerus, initial encounter for closed fracture: Secondary | ICD-10-CM | POA: Diagnosis not present

## 2013-02-17 DIAGNOSIS — M25519 Pain in unspecified shoulder: Secondary | ICD-10-CM | POA: Diagnosis not present

## 2013-02-17 DIAGNOSIS — S42293A Other displaced fracture of upper end of unspecified humerus, initial encounter for closed fracture: Secondary | ICD-10-CM | POA: Diagnosis not present

## 2013-02-21 DIAGNOSIS — S42293A Other displaced fracture of upper end of unspecified humerus, initial encounter for closed fracture: Secondary | ICD-10-CM | POA: Diagnosis not present

## 2013-02-21 DIAGNOSIS — M25519 Pain in unspecified shoulder: Secondary | ICD-10-CM | POA: Diagnosis not present

## 2013-02-23 DIAGNOSIS — S42293A Other displaced fracture of upper end of unspecified humerus, initial encounter for closed fracture: Secondary | ICD-10-CM | POA: Diagnosis not present

## 2013-02-23 DIAGNOSIS — M25519 Pain in unspecified shoulder: Secondary | ICD-10-CM | POA: Diagnosis not present

## 2013-02-24 DIAGNOSIS — S42293A Other displaced fracture of upper end of unspecified humerus, initial encounter for closed fracture: Secondary | ICD-10-CM | POA: Diagnosis not present

## 2013-02-24 DIAGNOSIS — M25519 Pain in unspecified shoulder: Secondary | ICD-10-CM | POA: Diagnosis not present

## 2013-02-28 DIAGNOSIS — S42293A Other displaced fracture of upper end of unspecified humerus, initial encounter for closed fracture: Secondary | ICD-10-CM | POA: Diagnosis not present

## 2013-02-28 DIAGNOSIS — M25519 Pain in unspecified shoulder: Secondary | ICD-10-CM | POA: Diagnosis not present

## 2013-03-02 DIAGNOSIS — M25519 Pain in unspecified shoulder: Secondary | ICD-10-CM | POA: Diagnosis not present

## 2013-03-02 DIAGNOSIS — S42293A Other displaced fracture of upper end of unspecified humerus, initial encounter for closed fracture: Secondary | ICD-10-CM | POA: Diagnosis not present

## 2013-03-04 DIAGNOSIS — S42293A Other displaced fracture of upper end of unspecified humerus, initial encounter for closed fracture: Secondary | ICD-10-CM | POA: Diagnosis not present

## 2013-03-04 DIAGNOSIS — M25519 Pain in unspecified shoulder: Secondary | ICD-10-CM | POA: Diagnosis not present

## 2013-03-07 DIAGNOSIS — M25519 Pain in unspecified shoulder: Secondary | ICD-10-CM | POA: Diagnosis not present

## 2013-03-07 DIAGNOSIS — S42293A Other displaced fracture of upper end of unspecified humerus, initial encounter for closed fracture: Secondary | ICD-10-CM | POA: Diagnosis not present

## 2013-03-09 DIAGNOSIS — M25519 Pain in unspecified shoulder: Secondary | ICD-10-CM | POA: Diagnosis not present

## 2013-03-09 DIAGNOSIS — S42293A Other displaced fracture of upper end of unspecified humerus, initial encounter for closed fracture: Secondary | ICD-10-CM | POA: Diagnosis not present

## 2013-03-10 DIAGNOSIS — S42293A Other displaced fracture of upper end of unspecified humerus, initial encounter for closed fracture: Secondary | ICD-10-CM | POA: Diagnosis not present

## 2013-03-10 DIAGNOSIS — M25519 Pain in unspecified shoulder: Secondary | ICD-10-CM | POA: Diagnosis not present

## 2013-03-21 DIAGNOSIS — M25519 Pain in unspecified shoulder: Secondary | ICD-10-CM | POA: Diagnosis not present

## 2013-03-21 DIAGNOSIS — S42293A Other displaced fracture of upper end of unspecified humerus, initial encounter for closed fracture: Secondary | ICD-10-CM | POA: Diagnosis not present

## 2013-03-23 DIAGNOSIS — S42293A Other displaced fracture of upper end of unspecified humerus, initial encounter for closed fracture: Secondary | ICD-10-CM | POA: Diagnosis not present

## 2013-03-23 DIAGNOSIS — M25519 Pain in unspecified shoulder: Secondary | ICD-10-CM | POA: Diagnosis not present

## 2013-03-31 DIAGNOSIS — M25519 Pain in unspecified shoulder: Secondary | ICD-10-CM | POA: Diagnosis not present

## 2013-03-31 DIAGNOSIS — S42293A Other displaced fracture of upper end of unspecified humerus, initial encounter for closed fracture: Secondary | ICD-10-CM | POA: Diagnosis not present

## 2013-04-01 DIAGNOSIS — M25519 Pain in unspecified shoulder: Secondary | ICD-10-CM | POA: Diagnosis not present

## 2013-04-01 DIAGNOSIS — S42293A Other displaced fracture of upper end of unspecified humerus, initial encounter for closed fracture: Secondary | ICD-10-CM | POA: Diagnosis not present

## 2013-04-04 DIAGNOSIS — M25519 Pain in unspecified shoulder: Secondary | ICD-10-CM | POA: Diagnosis not present

## 2013-04-04 DIAGNOSIS — S42293A Other displaced fracture of upper end of unspecified humerus, initial encounter for closed fracture: Secondary | ICD-10-CM | POA: Diagnosis not present

## 2013-04-06 DIAGNOSIS — M25519 Pain in unspecified shoulder: Secondary | ICD-10-CM | POA: Diagnosis not present

## 2013-04-06 DIAGNOSIS — S42293A Other displaced fracture of upper end of unspecified humerus, initial encounter for closed fracture: Secondary | ICD-10-CM | POA: Diagnosis not present

## 2013-04-11 DIAGNOSIS — M25519 Pain in unspecified shoulder: Secondary | ICD-10-CM | POA: Diagnosis not present

## 2013-04-11 DIAGNOSIS — S42293A Other displaced fracture of upper end of unspecified humerus, initial encounter for closed fracture: Secondary | ICD-10-CM | POA: Diagnosis not present

## 2013-04-18 DIAGNOSIS — M25519 Pain in unspecified shoulder: Secondary | ICD-10-CM | POA: Diagnosis not present

## 2013-04-18 DIAGNOSIS — S42293A Other displaced fracture of upper end of unspecified humerus, initial encounter for closed fracture: Secondary | ICD-10-CM | POA: Diagnosis not present

## 2013-04-20 DIAGNOSIS — S42293A Other displaced fracture of upper end of unspecified humerus, initial encounter for closed fracture: Secondary | ICD-10-CM | POA: Diagnosis not present

## 2013-04-20 DIAGNOSIS — M25519 Pain in unspecified shoulder: Secondary | ICD-10-CM | POA: Diagnosis not present

## 2013-04-21 DIAGNOSIS — M949 Disorder of cartilage, unspecified: Secondary | ICD-10-CM | POA: Diagnosis not present

## 2013-04-21 DIAGNOSIS — M899 Disorder of bone, unspecified: Secondary | ICD-10-CM | POA: Diagnosis not present

## 2013-04-27 DIAGNOSIS — S42293A Other displaced fracture of upper end of unspecified humerus, initial encounter for closed fracture: Secondary | ICD-10-CM | POA: Diagnosis not present

## 2013-04-27 DIAGNOSIS — M25519 Pain in unspecified shoulder: Secondary | ICD-10-CM | POA: Diagnosis not present

## 2013-04-28 DIAGNOSIS — M25519 Pain in unspecified shoulder: Secondary | ICD-10-CM | POA: Diagnosis not present

## 2013-04-28 DIAGNOSIS — S42293A Other displaced fracture of upper end of unspecified humerus, initial encounter for closed fracture: Secondary | ICD-10-CM | POA: Diagnosis not present

## 2013-05-04 DIAGNOSIS — S42293A Other displaced fracture of upper end of unspecified humerus, initial encounter for closed fracture: Secondary | ICD-10-CM | POA: Diagnosis not present

## 2013-05-04 DIAGNOSIS — M25519 Pain in unspecified shoulder: Secondary | ICD-10-CM | POA: Diagnosis not present

## 2013-05-05 ENCOUNTER — Ambulatory Visit
Admission: RE | Admit: 2013-05-05 | Discharge: 2013-05-05 | Disposition: A | Discharge status: Home / Self Care | Payer: Federal, State, Local not specified - PPO | Source: Ambulatory Visit | Attending: Family Medicine | Admitting: Family Medicine

## 2013-05-05 DIAGNOSIS — Z1231 Encounter for screening mammogram for malignant neoplasm of breast: Secondary | ICD-10-CM

## 2013-05-06 DIAGNOSIS — S42293A Other displaced fracture of upper end of unspecified humerus, initial encounter for closed fracture: Secondary | ICD-10-CM | POA: Diagnosis not present

## 2013-05-06 DIAGNOSIS — M25519 Pain in unspecified shoulder: Secondary | ICD-10-CM | POA: Diagnosis not present

## 2013-05-11 DIAGNOSIS — S42293A Other displaced fracture of upper end of unspecified humerus, initial encounter for closed fracture: Secondary | ICD-10-CM | POA: Diagnosis not present

## 2013-05-16 DIAGNOSIS — S42293A Other displaced fracture of upper end of unspecified humerus, initial encounter for closed fracture: Secondary | ICD-10-CM | POA: Diagnosis not present

## 2013-05-18 DIAGNOSIS — S42293A Other displaced fracture of upper end of unspecified humerus, initial encounter for closed fracture: Secondary | ICD-10-CM | POA: Diagnosis not present

## 2013-06-13 DIAGNOSIS — S42293A Other displaced fracture of upper end of unspecified humerus, initial encounter for closed fracture: Secondary | ICD-10-CM | POA: Diagnosis not present

## 2013-06-13 DIAGNOSIS — M25519 Pain in unspecified shoulder: Secondary | ICD-10-CM | POA: Diagnosis not present

## 2013-06-15 DIAGNOSIS — M25519 Pain in unspecified shoulder: Secondary | ICD-10-CM | POA: Diagnosis not present

## 2013-12-01 DIAGNOSIS — D259 Leiomyoma of uterus, unspecified: Secondary | ICD-10-CM | POA: Diagnosis not present

## 2013-12-01 DIAGNOSIS — Q649 Congenital malformation of urinary system, unspecified: Secondary | ICD-10-CM | POA: Diagnosis not present

## 2013-12-01 DIAGNOSIS — Z0000 Encounter for general adult medical examination without abnormal findings: Secondary | ICD-10-CM | POA: Diagnosis not present

## 2013-12-01 DIAGNOSIS — Z01419 Encounter for gynecological examination (general) (routine) without abnormal findings: Secondary | ICD-10-CM | POA: Diagnosis not present

## 2013-12-07 DIAGNOSIS — M25519 Pain in unspecified shoulder: Secondary | ICD-10-CM | POA: Diagnosis not present

## 2013-12-12 DIAGNOSIS — H1030 Unspecified acute conjunctivitis, unspecified eye: Secondary | ICD-10-CM | POA: Diagnosis not present

## 2014-01-05 DIAGNOSIS — G43909 Migraine, unspecified, not intractable, without status migrainosus: Secondary | ICD-10-CM | POA: Diagnosis not present

## 2014-01-05 DIAGNOSIS — R030 Elevated blood-pressure reading, without diagnosis of hypertension: Secondary | ICD-10-CM | POA: Diagnosis not present

## 2014-01-05 DIAGNOSIS — Z23 Encounter for immunization: Secondary | ICD-10-CM | POA: Diagnosis not present

## 2014-01-05 DIAGNOSIS — D509 Iron deficiency anemia, unspecified: Secondary | ICD-10-CM | POA: Diagnosis not present

## 2014-02-20 DIAGNOSIS — H4011X0 Primary open-angle glaucoma, stage unspecified: Secondary | ICD-10-CM | POA: Diagnosis not present

## 2014-02-20 DIAGNOSIS — H43819 Vitreous degeneration, unspecified eye: Secondary | ICD-10-CM | POA: Diagnosis not present

## 2014-02-20 DIAGNOSIS — H25019 Cortical age-related cataract, unspecified eye: Secondary | ICD-10-CM | POA: Diagnosis not present

## 2014-02-20 DIAGNOSIS — H409 Unspecified glaucoma: Secondary | ICD-10-CM | POA: Diagnosis not present

## 2014-03-28 DIAGNOSIS — Z0000 Encounter for general adult medical examination without abnormal findings: Secondary | ICD-10-CM | POA: Diagnosis not present

## 2014-03-28 DIAGNOSIS — E559 Vitamin D deficiency, unspecified: Secondary | ICD-10-CM | POA: Diagnosis not present

## 2014-03-28 DIAGNOSIS — D3A00 Benign carcinoid tumor of unspecified site: Secondary | ICD-10-CM | POA: Diagnosis not present

## 2014-03-28 DIAGNOSIS — E785 Hyperlipidemia, unspecified: Secondary | ICD-10-CM | POA: Diagnosis not present

## 2014-03-28 DIAGNOSIS — R030 Elevated blood-pressure reading, without diagnosis of hypertension: Secondary | ICD-10-CM | POA: Diagnosis not present

## 2014-03-28 DIAGNOSIS — D649 Anemia, unspecified: Secondary | ICD-10-CM | POA: Diagnosis not present

## 2014-04-21 DIAGNOSIS — H409 Unspecified glaucoma: Secondary | ICD-10-CM | POA: Diagnosis not present

## 2014-04-21 DIAGNOSIS — H4011X0 Primary open-angle glaucoma, stage unspecified: Secondary | ICD-10-CM | POA: Diagnosis not present

## 2014-04-25 ENCOUNTER — Other Ambulatory Visit: Payer: Self-pay

## 2014-04-25 DIAGNOSIS — Z1231 Encounter for screening mammogram for malignant neoplasm of breast: Secondary | ICD-10-CM

## 2014-05-08 ENCOUNTER — Ambulatory Visit
Admission: RE | Admit: 2014-05-08 | Discharge: 2014-05-08 | Disposition: A | Discharge status: Home / Self Care | Payer: Medicare Other | Source: Ambulatory Visit

## 2014-05-08 DIAGNOSIS — Z1231 Encounter for screening mammogram for malignant neoplasm of breast: Secondary | ICD-10-CM | POA: Diagnosis not present

## 2014-09-06 DIAGNOSIS — M75102 Unspecified rotator cuff tear or rupture of left shoulder, not specified as traumatic: Secondary | ICD-10-CM | POA: Diagnosis not present

## 2014-12-01 DIAGNOSIS — H4011X2 Primary open-angle glaucoma, moderate stage: Secondary | ICD-10-CM | POA: Diagnosis not present

## 2014-12-04 DIAGNOSIS — D251 Intramural leiomyoma of uterus: Secondary | ICD-10-CM | POA: Diagnosis not present

## 2014-12-04 DIAGNOSIS — Q632 Ectopic kidney: Secondary | ICD-10-CM | POA: Diagnosis not present

## 2014-12-04 DIAGNOSIS — Z1389 Encounter for screening for other disorder: Secondary | ICD-10-CM | POA: Diagnosis not present

## 2014-12-04 DIAGNOSIS — Z130 Encounter for screening for diseases of the blood and blood-forming organs and certain disorders involving the immune mechanism: Secondary | ICD-10-CM | POA: Diagnosis not present

## 2014-12-04 DIAGNOSIS — Z01419 Encounter for gynecological examination (general) (routine) without abnormal findings: Secondary | ICD-10-CM | POA: Diagnosis not present

## 2014-12-15 IMAGING — RF DG C-ARM 61-120 MIN
1 series · 2 of 2 positions shown · non-contrast
Comparison: None.

CLINICAL DATA: Right humerus fracture.

DG C-ARM 61-120 MIN,RIGHT HUMERUS - 2+ VIEW
TECHNIQUE: Two intraoperative fluoroscopic spot films.

[Series 1: run · 2 of 2 slices shown]
[im 1/2]
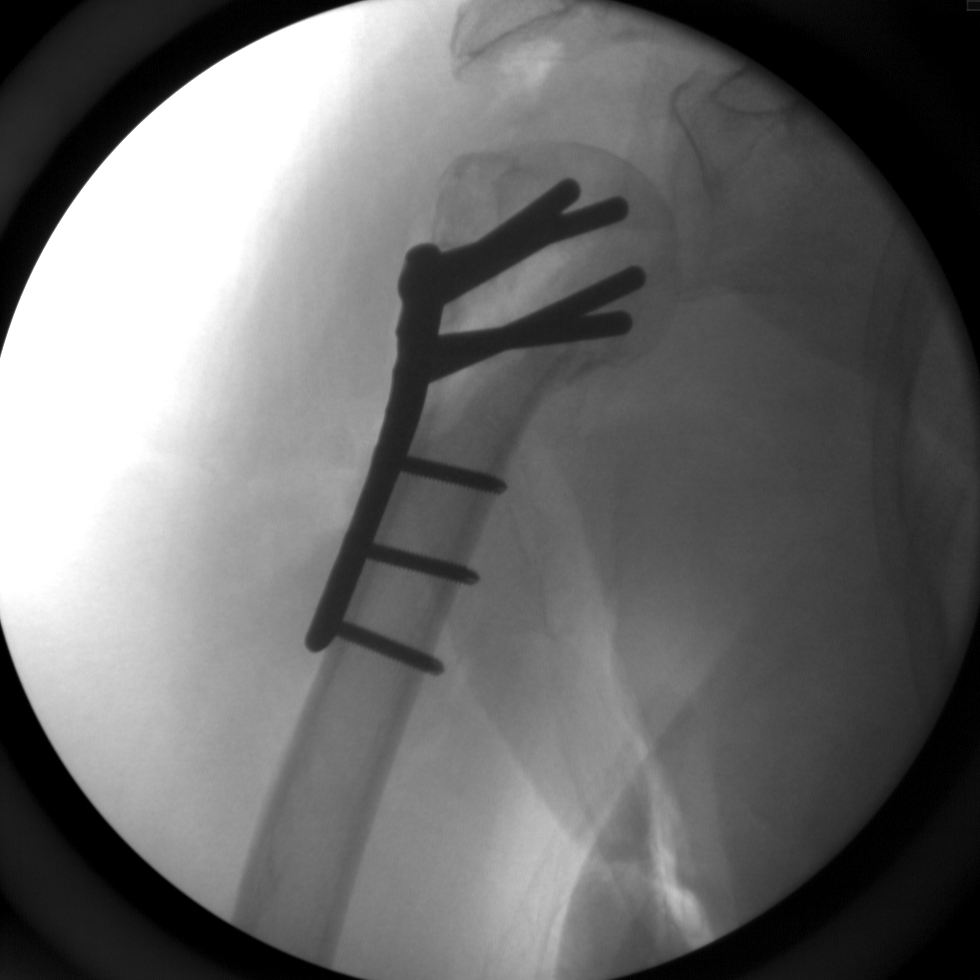
[im 2/2]
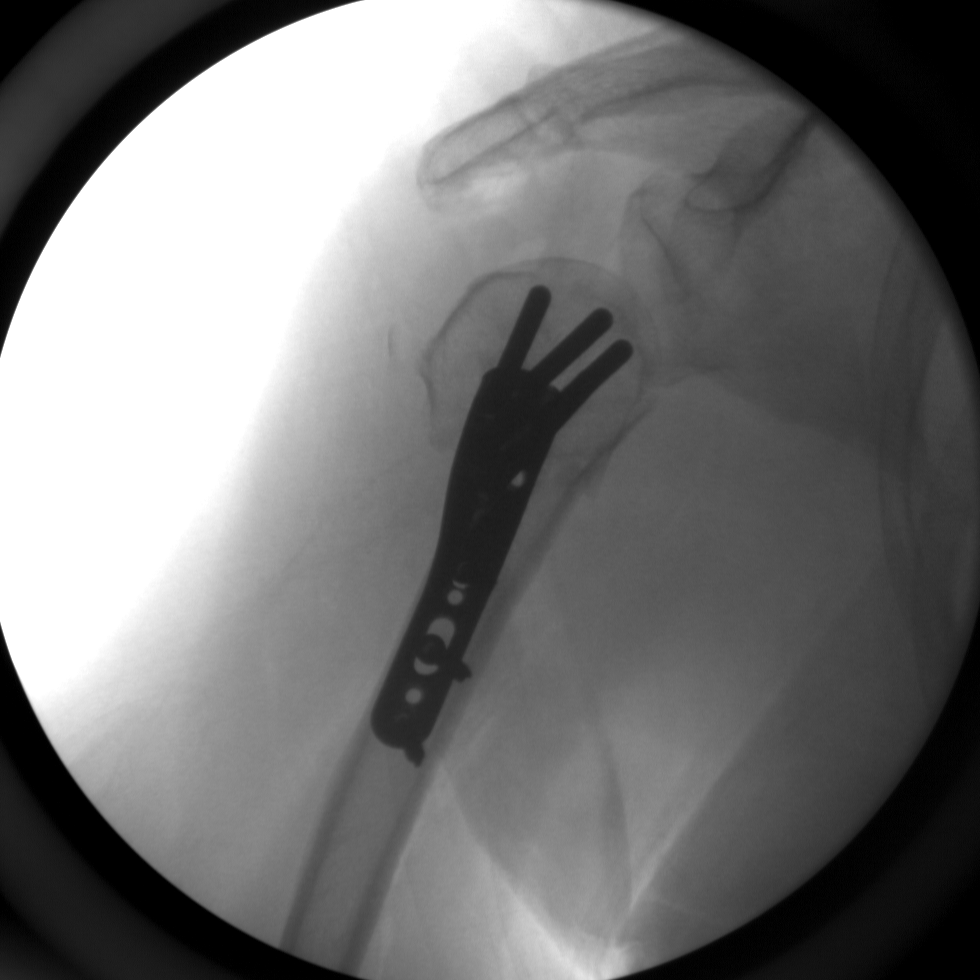

[2 of 2 positions shown; findings below may reference images not displayed]

FINDINGS: Lateral plate screw fixation of proximal right humerus
fracture.
IMPRESSION: ORIF proximal right humerus fracture.

## 2015-04-12 ENCOUNTER — Other Ambulatory Visit: Payer: Self-pay

## 2015-04-12 DIAGNOSIS — Z1231 Encounter for screening mammogram for malignant neoplasm of breast: Secondary | ICD-10-CM

## 2015-04-24 DIAGNOSIS — G5601 Carpal tunnel syndrome, right upper limb: Secondary | ICD-10-CM | POA: Diagnosis not present

## 2015-04-24 DIAGNOSIS — M2540 Effusion, unspecified joint: Secondary | ICD-10-CM | POA: Diagnosis not present

## 2015-05-11 ENCOUNTER — Ambulatory Visit
Admission: RE | Admit: 2015-05-11 | Discharge: 2015-05-11 | Disposition: A | Discharge status: Home / Self Care | Payer: Medicare Other | Source: Ambulatory Visit | Attending: Family Medicine | Admitting: Family Medicine

## 2015-05-11 ENCOUNTER — Other Ambulatory Visit: Payer: Self-pay | Admitting: Family Medicine

## 2015-05-11 ENCOUNTER — Ambulatory Visit
Admission: RE | Admit: 2015-05-11 | Discharge: 2015-05-11 | Disposition: A | Discharge status: Home / Self Care | Payer: Medicare Other | Source: Ambulatory Visit

## 2015-05-11 DIAGNOSIS — Z1231 Encounter for screening mammogram for malignant neoplasm of breast: Secondary | ICD-10-CM | POA: Diagnosis not present

## 2015-05-11 DIAGNOSIS — M2540 Effusion, unspecified joint: Secondary | ICD-10-CM

## 2015-05-11 DIAGNOSIS — M19012 Primary osteoarthritis, left shoulder: Secondary | ICD-10-CM | POA: Diagnosis not present

## 2015-06-06 DIAGNOSIS — H4011X2 Primary open-angle glaucoma, moderate stage: Secondary | ICD-10-CM | POA: Diagnosis not present

## 2015-06-06 DIAGNOSIS — H43813 Vitreous degeneration, bilateral: Secondary | ICD-10-CM | POA: Diagnosis not present

## 2015-06-06 DIAGNOSIS — H524 Presbyopia: Secondary | ICD-10-CM | POA: Diagnosis not present

## 2015-06-06 DIAGNOSIS — H25013 Cortical age-related cataract, bilateral: Secondary | ICD-10-CM | POA: Diagnosis not present

## 2015-06-30 DIAGNOSIS — M62838 Other muscle spasm: Secondary | ICD-10-CM | POA: Diagnosis not present

## 2015-08-30 DIAGNOSIS — H15101 Unspecified episcleritis, right eye: Secondary | ICD-10-CM | POA: Diagnosis not present

## 2015-08-30 DIAGNOSIS — H1132 Conjunctival hemorrhage, left eye: Secondary | ICD-10-CM | POA: Diagnosis not present

## 2015-09-27 DIAGNOSIS — R030 Elevated blood-pressure reading, without diagnosis of hypertension: Secondary | ICD-10-CM | POA: Diagnosis not present

## 2015-09-27 DIAGNOSIS — J329 Chronic sinusitis, unspecified: Secondary | ICD-10-CM | POA: Diagnosis not present

## 2015-09-27 DIAGNOSIS — Z23 Encounter for immunization: Secondary | ICD-10-CM | POA: Diagnosis not present

## 2015-09-27 DIAGNOSIS — E559 Vitamin D deficiency, unspecified: Secondary | ICD-10-CM | POA: Diagnosis not present

## 2015-09-27 DIAGNOSIS — M25571 Pain in right ankle and joints of right foot: Secondary | ICD-10-CM | POA: Diagnosis not present

## 2015-09-27 DIAGNOSIS — E785 Hyperlipidemia, unspecified: Secondary | ICD-10-CM | POA: Diagnosis not present

## 2015-09-27 DIAGNOSIS — Z0000 Encounter for general adult medical examination without abnormal findings: Secondary | ICD-10-CM | POA: Diagnosis not present

## 2015-10-02 ENCOUNTER — Other Ambulatory Visit: Payer: Self-pay | Admitting: Family Medicine

## 2015-10-02 ENCOUNTER — Ambulatory Visit
Admission: RE | Admit: 2015-10-02 | Discharge: 2015-10-02 | Disposition: A | Discharge status: Home / Self Care | Payer: Medicare Other | Source: Ambulatory Visit | Attending: Family Medicine | Admitting: Family Medicine

## 2015-10-02 DIAGNOSIS — M25571 Pain in right ankle and joints of right foot: Secondary | ICD-10-CM

## 2015-10-15 DIAGNOSIS — R1084 Generalized abdominal pain: Secondary | ICD-10-CM | POA: Diagnosis not present

## 2015-10-16 DIAGNOSIS — R1012 Left upper quadrant pain: Secondary | ICD-10-CM | POA: Diagnosis not present

## 2015-12-05 DIAGNOSIS — H401111 Primary open-angle glaucoma, right eye, mild stage: Secondary | ICD-10-CM | POA: Diagnosis not present

## 2015-12-05 DIAGNOSIS — H401122 Primary open-angle glaucoma, left eye, moderate stage: Secondary | ICD-10-CM | POA: Diagnosis not present

## 2015-12-12 DIAGNOSIS — Q632 Ectopic kidney: Secondary | ICD-10-CM | POA: Diagnosis not present

## 2015-12-12 DIAGNOSIS — Z01419 Encounter for gynecological examination (general) (routine) without abnormal findings: Secondary | ICD-10-CM | POA: Diagnosis not present

## 2015-12-21 DIAGNOSIS — Z01419 Encounter for gynecological examination (general) (routine) without abnormal findings: Secondary | ICD-10-CM | POA: Diagnosis not present

## 2016-03-18 DIAGNOSIS — M79671 Pain in right foot: Secondary | ICD-10-CM | POA: Diagnosis not present

## 2016-04-10 DIAGNOSIS — M25572 Pain in left ankle and joints of left foot: Secondary | ICD-10-CM | POA: Diagnosis not present

## 2016-04-16 DIAGNOSIS — M76821 Posterior tibial tendinitis, right leg: Secondary | ICD-10-CM | POA: Diagnosis not present

## 2016-04-16 DIAGNOSIS — M2021 Hallux rigidus, right foot: Secondary | ICD-10-CM | POA: Diagnosis not present

## 2016-04-29 ENCOUNTER — Other Ambulatory Visit: Payer: Self-pay | Admitting: Family Medicine

## 2016-04-29 DIAGNOSIS — Z1231 Encounter for screening mammogram for malignant neoplasm of breast: Secondary | ICD-10-CM

## 2016-05-14 ENCOUNTER — Ambulatory Visit
Admission: RE | Admit: 2016-05-14 | Discharge: 2016-05-14 | Disposition: A | Discharge status: Home / Self Care | Payer: Medicare Other | Source: Ambulatory Visit | Attending: Family Medicine | Admitting: Family Medicine

## 2016-05-14 DIAGNOSIS — Z1231 Encounter for screening mammogram for malignant neoplasm of breast: Secondary | ICD-10-CM

## 2016-06-04 DIAGNOSIS — H401122 Primary open-angle glaucoma, left eye, moderate stage: Secondary | ICD-10-CM | POA: Diagnosis not present

## 2016-06-04 DIAGNOSIS — H401111 Primary open-angle glaucoma, right eye, mild stage: Secondary | ICD-10-CM | POA: Diagnosis not present

## 2016-06-04 DIAGNOSIS — H43813 Vitreous degeneration, bilateral: Secondary | ICD-10-CM | POA: Diagnosis not present

## 2016-06-04 DIAGNOSIS — H524 Presbyopia: Secondary | ICD-10-CM | POA: Diagnosis not present

## 2016-06-05 DIAGNOSIS — H43813 Vitreous degeneration, bilateral: Secondary | ICD-10-CM | POA: Diagnosis not present

## 2016-06-05 DIAGNOSIS — H33301 Unspecified retinal break, right eye: Secondary | ICD-10-CM | POA: Diagnosis not present

## 2016-07-04 DIAGNOSIS — H15101 Unspecified episcleritis, right eye: Secondary | ICD-10-CM | POA: Diagnosis not present

## 2016-09-29 DIAGNOSIS — Z0000 Encounter for general adult medical examination without abnormal findings: Secondary | ICD-10-CM | POA: Diagnosis not present

## 2016-09-29 DIAGNOSIS — R030 Elevated blood-pressure reading, without diagnosis of hypertension: Secondary | ICD-10-CM | POA: Diagnosis not present

## 2016-09-29 DIAGNOSIS — Z1211 Encounter for screening for malignant neoplasm of colon: Secondary | ICD-10-CM | POA: Diagnosis not present

## 2016-09-29 DIAGNOSIS — M8580 Other specified disorders of bone density and structure, unspecified site: Secondary | ICD-10-CM | POA: Diagnosis not present

## 2016-09-29 DIAGNOSIS — E559 Vitamin D deficiency, unspecified: Secondary | ICD-10-CM | POA: Diagnosis not present

## 2016-09-29 DIAGNOSIS — E6609 Other obesity due to excess calories: Secondary | ICD-10-CM | POA: Diagnosis not present

## 2016-09-29 DIAGNOSIS — E785 Hyperlipidemia, unspecified: Secondary | ICD-10-CM | POA: Diagnosis not present

## 2016-10-06 DIAGNOSIS — B029 Zoster without complications: Secondary | ICD-10-CM | POA: Diagnosis not present

## 2016-10-06 DIAGNOSIS — R030 Elevated blood-pressure reading, without diagnosis of hypertension: Secondary | ICD-10-CM | POA: Diagnosis not present

## 2016-10-28 DIAGNOSIS — H401111 Primary open-angle glaucoma, right eye, mild stage: Secondary | ICD-10-CM | POA: Diagnosis not present

## 2016-10-28 DIAGNOSIS — H401122 Primary open-angle glaucoma, left eye, moderate stage: Secondary | ICD-10-CM | POA: Diagnosis not present

## 2016-11-04 DIAGNOSIS — H33301 Unspecified retinal break, right eye: Secondary | ICD-10-CM | POA: Diagnosis not present

## 2016-11-04 DIAGNOSIS — H43813 Vitreous degeneration, bilateral: Secondary | ICD-10-CM | POA: Diagnosis not present

## 2016-12-17 DIAGNOSIS — Z124 Encounter for screening for malignant neoplasm of cervix: Secondary | ICD-10-CM | POA: Diagnosis not present

## 2016-12-17 DIAGNOSIS — D251 Intramural leiomyoma of uterus: Secondary | ICD-10-CM | POA: Diagnosis not present

## 2016-12-17 DIAGNOSIS — Z1389 Encounter for screening for other disorder: Secondary | ICD-10-CM | POA: Diagnosis not present

## 2016-12-17 DIAGNOSIS — Q632 Ectopic kidney: Secondary | ICD-10-CM | POA: Diagnosis not present

## 2016-12-17 DIAGNOSIS — Z01419 Encounter for gynecological examination (general) (routine) without abnormal findings: Secondary | ICD-10-CM | POA: Diagnosis not present

## 2016-12-17 DIAGNOSIS — Z130 Encounter for screening for diseases of the blood and blood-forming organs and certain disorders involving the immune mechanism: Secondary | ICD-10-CM | POA: Diagnosis not present

## 2016-12-18 DIAGNOSIS — Z124 Encounter for screening for malignant neoplasm of cervix: Secondary | ICD-10-CM | POA: Diagnosis not present

## 2017-01-20 DIAGNOSIS — M8588 Other specified disorders of bone density and structure, other site: Secondary | ICD-10-CM | POA: Diagnosis not present

## 2017-04-27 DIAGNOSIS — Z1211 Encounter for screening for malignant neoplasm of colon: Secondary | ICD-10-CM | POA: Diagnosis not present

## 2017-04-27 DIAGNOSIS — K641 Second degree hemorrhoids: Secondary | ICD-10-CM | POA: Diagnosis not present

## 2017-05-05 DIAGNOSIS — H43813 Vitreous degeneration, bilateral: Secondary | ICD-10-CM | POA: Diagnosis not present

## 2017-05-05 DIAGNOSIS — H33301 Unspecified retinal break, right eye: Secondary | ICD-10-CM | POA: Diagnosis not present

## 2017-05-05 DIAGNOSIS — H35373 Puckering of macula, bilateral: Secondary | ICD-10-CM | POA: Diagnosis not present

## 2017-05-08 ENCOUNTER — Other Ambulatory Visit: Payer: Self-pay | Admitting: Family Medicine

## 2017-05-08 DIAGNOSIS — Z1231 Encounter for screening mammogram for malignant neoplasm of breast: Secondary | ICD-10-CM

## 2017-05-19 ENCOUNTER — Ambulatory Visit: Payer: Medicare Other

## 2017-05-19 ENCOUNTER — Ambulatory Visit
Admission: RE | Admit: 2017-05-19 | Discharge: 2017-05-19 | Disposition: A | Discharge status: Home / Self Care | Payer: Medicare Other | Source: Ambulatory Visit | Attending: Family Medicine | Admitting: Family Medicine

## 2017-05-19 DIAGNOSIS — Z1231 Encounter for screening mammogram for malignant neoplasm of breast: Secondary | ICD-10-CM

## 2017-05-20 ENCOUNTER — Ambulatory Visit: Payer: Medicare Other

## 2017-05-21 ENCOUNTER — Other Ambulatory Visit: Payer: Self-pay | Admitting: Family Medicine

## 2017-05-21 DIAGNOSIS — R928 Other abnormal and inconclusive findings on diagnostic imaging of breast: Secondary | ICD-10-CM

## 2017-05-27 ENCOUNTER — Ambulatory Visit
Admission: RE | Admit: 2017-05-27 | Discharge: 2017-05-27 | Disposition: A | Discharge status: Home / Self Care | Payer: Medicare Other | Source: Ambulatory Visit | Attending: Family Medicine | Admitting: Family Medicine

## 2017-05-27 ENCOUNTER — Other Ambulatory Visit: Payer: Self-pay | Admitting: Family Medicine

## 2017-05-27 DIAGNOSIS — R928 Other abnormal and inconclusive findings on diagnostic imaging of breast: Secondary | ICD-10-CM

## 2017-05-27 DIAGNOSIS — N6321 Unspecified lump in the left breast, upper outer quadrant: Secondary | ICD-10-CM | POA: Diagnosis not present

## 2017-05-27 DIAGNOSIS — M79672 Pain in left foot: Secondary | ICD-10-CM | POA: Diagnosis not present

## 2017-05-27 DIAGNOSIS — N6323 Unspecified lump in the left breast, lower outer quadrant: Secondary | ICD-10-CM | POA: Diagnosis not present

## 2017-05-27 DIAGNOSIS — N63 Unspecified lump in unspecified breast: Secondary | ICD-10-CM

## 2017-06-09 DIAGNOSIS — H5213 Myopia, bilateral: Secondary | ICD-10-CM | POA: Diagnosis not present

## 2017-06-09 DIAGNOSIS — H2513 Age-related nuclear cataract, bilateral: Secondary | ICD-10-CM | POA: Diagnosis not present

## 2017-06-09 DIAGNOSIS — H401111 Primary open-angle glaucoma, right eye, mild stage: Secondary | ICD-10-CM | POA: Diagnosis not present

## 2017-06-09 DIAGNOSIS — H401122 Primary open-angle glaucoma, left eye, moderate stage: Secondary | ICD-10-CM | POA: Diagnosis not present

## 2017-08-17 DIAGNOSIS — H15101 Unspecified episcleritis, right eye: Secondary | ICD-10-CM | POA: Diagnosis not present

## 2017-09-30 DIAGNOSIS — Z23 Encounter for immunization: Secondary | ICD-10-CM | POA: Diagnosis not present

## 2017-09-30 DIAGNOSIS — E785 Hyperlipidemia, unspecified: Secondary | ICD-10-CM | POA: Diagnosis not present

## 2017-09-30 DIAGNOSIS — Z0000 Encounter for general adult medical examination without abnormal findings: Secondary | ICD-10-CM | POA: Diagnosis not present

## 2017-09-30 DIAGNOSIS — Z1389 Encounter for screening for other disorder: Secondary | ICD-10-CM | POA: Diagnosis not present

## 2017-09-30 DIAGNOSIS — E559 Vitamin D deficiency, unspecified: Secondary | ICD-10-CM | POA: Diagnosis not present

## 2017-09-30 DIAGNOSIS — M79671 Pain in right foot: Secondary | ICD-10-CM | POA: Diagnosis not present

## 2017-09-30 DIAGNOSIS — Z6833 Body mass index (BMI) 33.0-33.9, adult: Secondary | ICD-10-CM | POA: Diagnosis not present

## 2017-10-05 DIAGNOSIS — H401122 Primary open-angle glaucoma, left eye, moderate stage: Secondary | ICD-10-CM | POA: Diagnosis not present

## 2017-10-05 DIAGNOSIS — H401111 Primary open-angle glaucoma, right eye, mild stage: Secondary | ICD-10-CM | POA: Diagnosis not present

## 2017-11-18 DIAGNOSIS — H33301 Unspecified retinal break, right eye: Secondary | ICD-10-CM | POA: Diagnosis not present

## 2017-11-18 DIAGNOSIS — H35372 Puckering of macula, left eye: Secondary | ICD-10-CM | POA: Diagnosis not present

## 2017-11-18 DIAGNOSIS — H43813 Vitreous degeneration, bilateral: Secondary | ICD-10-CM | POA: Diagnosis not present

## 2017-11-26 ENCOUNTER — Other Ambulatory Visit: Payer: Medicare Other

## 2017-12-29 ENCOUNTER — Other Ambulatory Visit: Payer: Self-pay | Admitting: Family Medicine

## 2017-12-29 ENCOUNTER — Ambulatory Visit
Admission: RE | Admit: 2017-12-29 | Discharge: 2017-12-29 | Disposition: A | Discharge status: Home / Self Care | Payer: Medicare Other | Source: Ambulatory Visit | Attending: Family Medicine | Admitting: Family Medicine

## 2017-12-29 DIAGNOSIS — N63 Unspecified lump in unspecified breast: Secondary | ICD-10-CM

## 2017-12-29 DIAGNOSIS — N6323 Unspecified lump in the left breast, lower outer quadrant: Secondary | ICD-10-CM | POA: Diagnosis not present

## 2017-12-29 DIAGNOSIS — R928 Other abnormal and inconclusive findings on diagnostic imaging of breast: Secondary | ICD-10-CM | POA: Diagnosis not present

## 2017-12-29 DIAGNOSIS — N6321 Unspecified lump in the left breast, upper outer quadrant: Secondary | ICD-10-CM | POA: Diagnosis not present

## 2018-03-03 DIAGNOSIS — H00011 Hordeolum externum right upper eyelid: Secondary | ICD-10-CM | POA: Diagnosis not present

## 2018-03-03 DIAGNOSIS — H401111 Primary open-angle glaucoma, right eye, mild stage: Secondary | ICD-10-CM | POA: Diagnosis not present

## 2018-03-03 DIAGNOSIS — H401122 Primary open-angle glaucoma, left eye, moderate stage: Secondary | ICD-10-CM | POA: Diagnosis not present

## 2018-05-26 DIAGNOSIS — H43813 Vitreous degeneration, bilateral: Secondary | ICD-10-CM | POA: Diagnosis not present

## 2018-05-26 DIAGNOSIS — H35372 Puckering of macula, left eye: Secondary | ICD-10-CM | POA: Diagnosis not present

## 2018-05-26 DIAGNOSIS — H33301 Unspecified retinal break, right eye: Secondary | ICD-10-CM | POA: Diagnosis not present

## 2018-07-05 ENCOUNTER — Inpatient Hospital Stay
Admission: RE | Admit: 2018-07-05 | Discharge: 2018-07-05 | Disposition: A | Discharge status: Home / Self Care | Payer: Medicare Other | Source: Ambulatory Visit | Attending: Family Medicine | Admitting: Family Medicine

## 2018-07-05 ENCOUNTER — Other Ambulatory Visit: Payer: Medicare Other

## 2018-07-19 ENCOUNTER — Ambulatory Visit
Admission: RE | Admit: 2018-07-19 | Discharge: 2018-07-19 | Disposition: A | Discharge status: Home / Self Care | Payer: Medicare Other | Source: Ambulatory Visit | Attending: Family Medicine | Admitting: Family Medicine

## 2018-07-19 DIAGNOSIS — N6323 Unspecified lump in the left breast, lower outer quadrant: Secondary | ICD-10-CM | POA: Diagnosis not present

## 2018-07-19 DIAGNOSIS — N63 Unspecified lump in unspecified breast: Secondary | ICD-10-CM

## 2018-08-31 DIAGNOSIS — H401122 Primary open-angle glaucoma, left eye, moderate stage: Secondary | ICD-10-CM | POA: Diagnosis not present

## 2018-08-31 DIAGNOSIS — H524 Presbyopia: Secondary | ICD-10-CM | POA: Diagnosis not present

## 2018-08-31 DIAGNOSIS — H25013 Cortical age-related cataract, bilateral: Secondary | ICD-10-CM | POA: Diagnosis not present

## 2018-08-31 DIAGNOSIS — H2513 Age-related nuclear cataract, bilateral: Secondary | ICD-10-CM | POA: Diagnosis not present

## 2018-10-04 DIAGNOSIS — E559 Vitamin D deficiency, unspecified: Secondary | ICD-10-CM | POA: Diagnosis not present

## 2018-10-04 DIAGNOSIS — Z0000 Encounter for general adult medical examination without abnormal findings: Secondary | ICD-10-CM | POA: Diagnosis not present

## 2018-10-04 DIAGNOSIS — Z87898 Personal history of other specified conditions: Secondary | ICD-10-CM | POA: Diagnosis not present

## 2018-10-04 DIAGNOSIS — E785 Hyperlipidemia, unspecified: Secondary | ICD-10-CM | POA: Diagnosis not present

## 2018-10-04 DIAGNOSIS — R030 Elevated blood-pressure reading, without diagnosis of hypertension: Secondary | ICD-10-CM | POA: Diagnosis not present

## 2018-10-04 DIAGNOSIS — E6609 Other obesity due to excess calories: Secondary | ICD-10-CM | POA: Diagnosis not present

## 2019-02-11 DIAGNOSIS — Z0100 Encounter for examination of eyes and vision without abnormal findings: Secondary | ICD-10-CM | POA: Diagnosis not present

## 2019-02-11 DIAGNOSIS — H401111 Primary open-angle glaucoma, right eye, mild stage: Secondary | ICD-10-CM | POA: Diagnosis not present

## 2019-02-11 DIAGNOSIS — H401122 Primary open-angle glaucoma, left eye, moderate stage: Secondary | ICD-10-CM | POA: Diagnosis not present

## 2019-03-14 DIAGNOSIS — I10 Essential (primary) hypertension: Secondary | ICD-10-CM | POA: Diagnosis not present

## 2019-03-16 DIAGNOSIS — R69 Illness, unspecified: Secondary | ICD-10-CM | POA: Diagnosis not present

## 2019-06-08 DIAGNOSIS — Z23 Encounter for immunization: Secondary | ICD-10-CM | POA: Diagnosis not present

## 2019-07-14 DIAGNOSIS — R69 Illness, unspecified: Secondary | ICD-10-CM | POA: Diagnosis not present

## 2019-07-25 DIAGNOSIS — H33301 Unspecified retinal break, right eye: Secondary | ICD-10-CM | POA: Diagnosis not present

## 2019-07-25 DIAGNOSIS — H25013 Cortical age-related cataract, bilateral: Secondary | ICD-10-CM | POA: Diagnosis not present

## 2019-07-25 DIAGNOSIS — H401122 Primary open-angle glaucoma, left eye, moderate stage: Secondary | ICD-10-CM | POA: Diagnosis not present

## 2019-07-25 DIAGNOSIS — H5213 Myopia, bilateral: Secondary | ICD-10-CM | POA: Diagnosis not present

## 2019-08-01 DIAGNOSIS — R21 Rash and other nonspecific skin eruption: Secondary | ICD-10-CM | POA: Diagnosis not present

## 2019-08-24 DIAGNOSIS — I10 Essential (primary) hypertension: Secondary | ICD-10-CM | POA: Diagnosis not present

## 2019-08-24 DIAGNOSIS — Z9181 History of falling: Secondary | ICD-10-CM | POA: Diagnosis not present

## 2019-08-24 DIAGNOSIS — Z825 Family history of asthma and other chronic lower respiratory diseases: Secondary | ICD-10-CM | POA: Diagnosis not present

## 2019-08-24 DIAGNOSIS — Z833 Family history of diabetes mellitus: Secondary | ICD-10-CM | POA: Diagnosis not present

## 2019-08-24 DIAGNOSIS — Z8249 Family history of ischemic heart disease and other diseases of the circulatory system: Secondary | ICD-10-CM | POA: Diagnosis not present

## 2019-08-24 DIAGNOSIS — Z809 Family history of malignant neoplasm, unspecified: Secondary | ICD-10-CM | POA: Diagnosis not present

## 2019-08-24 DIAGNOSIS — H40059 Ocular hypertension, unspecified eye: Secondary | ICD-10-CM | POA: Diagnosis not present

## 2019-08-24 DIAGNOSIS — E669 Obesity, unspecified: Secondary | ICD-10-CM | POA: Diagnosis not present

## 2019-08-24 DIAGNOSIS — Z6832 Body mass index (BMI) 32.0-32.9, adult: Secondary | ICD-10-CM | POA: Diagnosis not present

## 2019-08-24 DIAGNOSIS — L309 Dermatitis, unspecified: Secondary | ICD-10-CM | POA: Diagnosis not present

## 2019-10-17 DIAGNOSIS — Z7189 Other specified counseling: Secondary | ICD-10-CM | POA: Diagnosis not present

## 2019-10-17 DIAGNOSIS — E559 Vitamin D deficiency, unspecified: Secondary | ICD-10-CM | POA: Diagnosis not present

## 2019-10-17 DIAGNOSIS — E785 Hyperlipidemia, unspecified: Secondary | ICD-10-CM | POA: Diagnosis not present

## 2019-10-17 DIAGNOSIS — Z0000 Encounter for general adult medical examination without abnormal findings: Secondary | ICD-10-CM | POA: Diagnosis not present

## 2019-10-17 DIAGNOSIS — I10 Essential (primary) hypertension: Secondary | ICD-10-CM | POA: Diagnosis not present

## 2019-10-25 DIAGNOSIS — R7301 Impaired fasting glucose: Secondary | ICD-10-CM | POA: Diagnosis not present

## 2020-01-24 DIAGNOSIS — H401122 Primary open-angle glaucoma, left eye, moderate stage: Secondary | ICD-10-CM | POA: Diagnosis not present

## 2020-01-24 DIAGNOSIS — H401111 Primary open-angle glaucoma, right eye, mild stage: Secondary | ICD-10-CM | POA: Diagnosis not present

## 2020-02-16 DIAGNOSIS — R69 Illness, unspecified: Secondary | ICD-10-CM | POA: Diagnosis not present

## 2020-03-09 DIAGNOSIS — Z8249 Family history of ischemic heart disease and other diseases of the circulatory system: Secondary | ICD-10-CM | POA: Diagnosis not present

## 2020-03-09 DIAGNOSIS — Z833 Family history of diabetes mellitus: Secondary | ICD-10-CM | POA: Diagnosis not present

## 2020-03-09 DIAGNOSIS — H409 Unspecified glaucoma: Secondary | ICD-10-CM | POA: Diagnosis not present

## 2020-03-09 DIAGNOSIS — I10 Essential (primary) hypertension: Secondary | ICD-10-CM | POA: Diagnosis not present

## 2020-03-09 DIAGNOSIS — Z809 Family history of malignant neoplasm, unspecified: Secondary | ICD-10-CM | POA: Diagnosis not present

## 2020-03-09 DIAGNOSIS — Z823 Family history of stroke: Secondary | ICD-10-CM | POA: Diagnosis not present

## 2020-03-09 DIAGNOSIS — E669 Obesity, unspecified: Secondary | ICD-10-CM | POA: Diagnosis not present

## 2020-03-09 DIAGNOSIS — Z6833 Body mass index (BMI) 33.0-33.9, adult: Secondary | ICD-10-CM | POA: Diagnosis not present

## 2020-03-09 DIAGNOSIS — R69 Illness, unspecified: Secondary | ICD-10-CM | POA: Diagnosis not present

## 2020-04-02 ENCOUNTER — Encounter: Payer: Self-pay | Admitting: Emergency Medicine

## 2020-04-02 ENCOUNTER — Ambulatory Visit (INDEPENDENT_AMBULATORY_CARE_PROVIDER_SITE_OTHER): Payer: Medicare HMO

## 2020-04-02 ENCOUNTER — Other Ambulatory Visit: Payer: Self-pay

## 2020-04-02 ENCOUNTER — Ambulatory Visit
Admission: EM | Admit: 2020-04-02 | Discharge: 2020-04-02 | Disposition: A | Discharge status: Home / Self Care | Payer: Medicare HMO | Attending: Emergency Medicine | Admitting: Emergency Medicine

## 2020-04-02 DIAGNOSIS — S61012A Laceration without foreign body of left thumb without damage to nail, initial encounter: Secondary | ICD-10-CM | POA: Diagnosis not present

## 2020-04-02 DIAGNOSIS — S6702XA Crushing injury of left thumb, initial encounter: Secondary | ICD-10-CM

## 2020-04-02 DIAGNOSIS — W230XXA Caught, crushed, jammed, or pinched between moving objects, initial encounter: Secondary | ICD-10-CM

## 2020-04-02 DIAGNOSIS — Z23 Encounter for immunization: Secondary | ICD-10-CM

## 2020-04-02 DIAGNOSIS — S62522B Displaced fracture of distal phalanx of left thumb, initial encounter for open fracture: Secondary | ICD-10-CM | POA: Diagnosis not present

## 2020-04-02 DIAGNOSIS — S62522A Displaced fracture of distal phalanx of left thumb, initial encounter for closed fracture: Secondary | ICD-10-CM | POA: Diagnosis not present

## 2020-04-02 HISTORY — DX: Essential (primary) hypertension: I10

## 2020-04-02 MED ORDER — AMOXICILLIN-POT CLAVULANATE 875-125 MG PO TABS: 1.0000 | ORAL_TABLET | Freq: Two times a day (BID) | ORAL | 0 refills | Status: AC

## 2020-04-02 MED ORDER — TETANUS-DIPHTH-ACELL PERTUSSIS 5-2.5-18.5 LF-MCG/0.5 IM SUSP
0.5000 mL | Freq: Once | INTRAMUSCULAR | Status: AC
  Administered 2020-04-02: 0.5 mL via INTRAMUSCULAR

## 2020-04-02 NOTE — ED Triage Notes (Signed)
APP assessment prior to RN triage, please see provider note.

## 2020-04-02 NOTE — Discharge Instructions (Signed)
Recommend RICE: rest, ice, compression, elevation as needed for pain.    Heat therapy (hot compress, warm wash rag, hot showers, etc.) can help relax muscles and soothe muscle aches. Cold therapy (ice packs) can be used to help swelling both after injury and after prolonged use of areas of chronic pain/aches.  For pain: recommend 350 mg-1000 mg of Tylenol (acetaminophen) and/or 200 mg - 800 mg of Advil (ibuprofen, Motrin) every 8 hours as needed.  May alternate between the two throughout the day as they are generally safe to take together.  DO NOT exceed more than 3000 mg of Tylenol or 3200 mg of ibuprofen in a 24 hour period as this could damage your stomach, kidneys, liver, or increase your bleeding risk.  Important to follow-up with orthopedics for fractured (broken) thumb

## 2020-04-02 NOTE — ED Provider Notes (Signed)
[facility]    CSN: [id number] Arrival date & time: 04/02/20  0810      History   Chief Complaint Chief Complaint  Patient presents with  . Hand Pain    HPI Christie Davis is a 74 y.o. female presenting for left thumb laceration.  Patient provides history: States she slammed her left thumb in her car door last night around 5 PM.  Was able to apply pressure and gauze which controlled bleeding.  Patient denying numbness or nailbed avulsion.  Last tetanus unknown.   Past Medical History:  Diagnosis Date  . Chronic cough    off and on-  . Hypertension   . Medical history non-contributory   . Wears glasses     There are no problems to display for this patient.   Past Surgical History:  Procedure Laterality Date  . COLONOSCOPY    . ORIF HUMERUS FRACTURE Right 12/06/2012   Procedure: OPEN REDUCTION INTERNAL FIXATION (ORIF) PROXIMAL HUMERUS FRACTURE;  Surgeon: Nita Sells, MD;  Location: [facility];  Service: Orthopedics;  Laterality: Right;  . RECTAL TUMOR BY PROCTOTOMY EXCISION  2007  . TUBAL LIGATION      OB History   No obstetric history on file.      Home Medications    Prior to Admission medications   Medication Sig Start Date End Date Taking? Authorizing Provider  amLODipine (NORVASC) 10 MG tablet Take 10 mg by mouth daily.   Yes [provider]  amoxicillin-clavulanate (AUGMENTIN) 875-125 MG tablet Take 1 tablet by mouth every 12 (twelve) hours for 7 days. 04/02/20 04/09/20  Hall-Potvin, [place], PA-C  oxyCODONE-acetaminophen (ROXICET) 5-325 MG per tablet Take 1-2 tablets by mouth every 4 (four) hours as needed for pain. 12/06/12   Grier Mitts, PA-C  Vitamin D, Ergocalciferol, (DRISDOL) 50000 UNITS CAPS Take 50,000 Units by mouth every 7 (seven) days. Take on Mondays    [provider]    Family History History reviewed. No pertinent family history.  Social History Social History    Tobacco Use  . Smoking status: Never Smoker  . Smokeless tobacco: Never Used  Substance Use Topics  . Alcohol use: No  . Drug use: No     Allergies   Patient has no known allergies.   Review of Systems As per HPI   Physical Exam Triage Vital Signs ED Triage Vitals  Enc Vitals Group     BP      Pulse      Resp      Temp      Temp src      SpO2      Weight      Height      Head Circumference      Peak Flow      Pain Score      Pain Loc      Pain Edu?      Excl. in [place]?    No data found.  Updated Vital Signs BP (!) 157/86 (BP Location: Left Arm)   Pulse 75   Temp 98.1 F (36.7 C) (Oral)   Resp 18   SpO2 98%   Visual Acuity Right Eye Distance:   Left Eye Distance:   Bilateral Distance:    Right Eye Near:   Left Eye Near:    Bilateral Near:     Physical Exam Constitutional:      General: She is not in acute distress. HENT:     Head:  Normocephalic and atraumatic.  Eyes:     General: No scleral icterus.    Pupils: Pupils are equal, round, and reactive to light.  Cardiovascular:     Rate and Rhythm: Normal rate.  Pulmonary:     Effort: Pulmonary effort is normal.  Musculoskeletal:        General: Swelling and tenderness present. Normal range of motion.     Comments: Left thumb NVI  Skin:    Coloration: Skin is not jaundiced or pale.     Comments: 1 cm laceration noted to side of nailbed.  Nailbed intact.  Neurological:     Mental Status: She is alert and oriented to person, place, and time.      UC Treatments / Results  Labs (all labs ordered are listed, but only abnormal results are displayed) Labs Reviewed - No data to display  EKG   Radiology DG Finger Thumb Left  Result Date: 04/02/2020 CLINICAL DATA:  Distal thumb shut in car door yesterday. EXAM: LEFT THUMB 2+V COMPARISON:  None FINDINGS: Comminuted fracture deformity involving the tuft of the first distal phalanx is identified. Fracture fragments are mildly displaced. Gas  noted within the overlying nail bed extending to the bone. No dislocation identified. IMPRESSION: Comminuted fracture deformity involving the tuft of the first distal phalanx. Gas is noted within the adjacent nail bed, which is concerning for open fracture. Electronically Signed   By: Kerby Moors M.D.   On: 04/02/2020 08:43    Procedures Laceration Repair  Date/Time: 04/02/2020 12:59 PM Performed by: Quincy Sheehan, PA-C Authorized by: Quincy Sheehan, PA-C   Consent:    Consent obtained:  Verbal   Consent given by:  Patient   Risks discussed:  Infection, need for additional repair, pain, poor cosmetic result and poor wound healing   Alternatives discussed:  No treatment and delayed treatment Universal protocol:    Patient identity confirmed:  Verbally with patient Anesthesia (see MAR for exact dosages):    Anesthesia method:  Nerve block   Block location:  Left thumb MCP   Block needle gauge:  25 G   Block anesthetic:  Lidocaine 2% w/o epi   Block technique:  Digital   Block injection procedure:  Anatomic landmarks identified, anatomic landmarks palpated, introduced needle, negative aspiration for blood and incremental injection Laceration details:    Location:  Finger   Finger location:  L thumb   Length (cm):  1   Depth (mm):  5 Repair type:    Repair type:  Simple Pre-procedure details:    Preparation:  Patient was prepped and draped in usual sterile fashion Exploration:    Hemostasis achieved with:  Direct pressure   Wound exploration: wound explored through full range of motion     Wound extent: underlying fracture     Contaminated: no   Treatment:    Area cleansed with:  Saline and Betadine   Amount of cleaning:  Standard   Irrigation solution:  Sterile saline Skin repair:    Repair method:  Sutures   Suture size:  5-0   Suture material:  Prolene   Suture technique:  Simple interrupted   Number of sutures:  3 Approximation:    Approximation:   Close Post-procedure details:    Dressing:  Non-adherent dressing   Patient tolerance of procedure:  Tolerated well, no immediate complications   (including critical care time)  Medications Ordered in UC Medications  Tdap (BOOSTRIX) injection 0.5 mL (0.5 mLs Intramuscular Given 04/02/20 0851)  Initial Impression / Assessment and Plan / UC Course  I have reviewed the triage vital signs and the nursing notes.  Pertinent labs & imaging results that were available during my care of the patient were reviewed by me and considered in my medical decision making (see chart for details).     X-ray done office, reviewed by me read radiology: Significant for comminuted fracture deformity involving the tuft of the first distal phalanx.   Tetanus updated today, wound thoroughly irrigated, and 3 simple interrupted sutures placed.  Will complete 7-day course of Augmentin.  Provided contact information for orthopedic follow-up given fracture.  Return precautions discussed, patient verbalized understanding and is agreeable to plan. Final Clinical Impressions(s) / UC Diagnoses   Final diagnoses:  Crush injury to thumb, left, initial encounter  Thumb laceration, left, initial encounter  Open displaced fracture of distal phalanx of left thumb, initial encounter     Discharge Instructions     Recommend RICE: rest, ice, compression, elevation as needed for pain.    Heat therapy (hot compress, warm wash rag, hot showers, etc.) can help relax muscles and soothe muscle aches. Cold therapy (ice packs) can be used to help swelling both after injury and after prolonged use of areas of chronic pain/aches.  For pain: recommend 350 mg-1000 mg of Tylenol (acetaminophen) and/or 200 mg - 800 mg of Advil (ibuprofen, Motrin) every 8 hours as needed.  May alternate between the two throughout the day as they are generally safe to take together.  DO NOT exceed more than 3000 mg of Tylenol or 3200 mg of ibuprofen in a  24 hour period as this could damage your stomach, kidneys, liver, or increase your bleeding risk.  Important to follow-up with orthopedics for fractured (broken) thumb    ED Prescriptions    Medication Sig Dispense Auth. Provider   amoxicillin-clavulanate (AUGMENTIN) 875-125 MG tablet Take 1 tablet by mouth every 12 (twelve) hours for 7 days. 14 tablet Hall-Potvin, [place], PA-C     PDMP not reviewed this encounter.   Hall-Potvin, [place], [place] 04/02/20 1300

## 2020-04-09 ENCOUNTER — Encounter: Payer: Self-pay | Admitting: Emergency Medicine

## 2020-04-09 ENCOUNTER — Ambulatory Visit
Admission: EM | Admit: 2020-04-09 | Discharge: 2020-04-09 | Disposition: A | Discharge status: Home / Self Care | Payer: Medicare HMO | Attending: Physician Assistant | Admitting: Physician Assistant

## 2020-04-09 ENCOUNTER — Other Ambulatory Visit: Payer: Self-pay

## 2020-04-09 DIAGNOSIS — Z4802 Encounter for removal of sutures: Secondary | ICD-10-CM

## 2020-04-09 DIAGNOSIS — Z5189 Encounter for other specified aftercare: Secondary | ICD-10-CM

## 2020-04-09 NOTE — Discharge Instructions (Signed)
No signs of infection.  Stitches removed today.  Finger splint applied.  Can clean gently with soap and water daily, otherwise keep the splint on to protect fracture.  Follow-up with hand orthopedic as scheduled for further evaluation and management needed.  If noticing signs of infection including redness, swelling, drainage, follow-up for reevaluation.

## 2020-04-09 NOTE — ED Provider Notes (Signed)
[facility]    CSN: [id number] Arrival date & time: 04/09/20  8295      History   Chief Complaint Chief Complaint  Patient presents with  . Suture / Staple Removal    HPI Christie Davis is a 74 y.o. female.   74 year old female comes in for suture removal after laceration repair 7 days ago.  Would also like to have wound checked as she has worries for infection.  At that time, had crushing injury, and noted tuft fracture with laceration.  The laceration was repaired, and was started on Augmentin due to worries for open fracture.  Patient has been taking Augmentin as directed without missing dosage.  Denies any spreading erythema, swelling, purulent drainage. Has been changing dressing, but have not cleaned the area. No splint was applied last visit, states will have pain to the area at times. Have follow up appointment with orthopedics next week.     Past Medical History:  Diagnosis Date  . Chronic cough    off and on-  . Hypertension   . Medical history non-contributory   . Wears glasses     There are no problems to display for this patient.   Past Surgical History:  Procedure Laterality Date  . COLONOSCOPY    . ORIF HUMERUS FRACTURE Right 12/06/2012   Procedure: OPEN REDUCTION INTERNAL FIXATION (ORIF) PROXIMAL HUMERUS FRACTURE;  Surgeon: Nita Sells, MD;  Location: [facility];  Service: Orthopedics;  Laterality: Right;  . RECTAL TUMOR BY PROCTOTOMY EXCISION  2007  . TUBAL LIGATION      OB History   No obstetric history on file.      Home Medications    Prior to Admission medications   Medication Sig Start Date End Date Taking? Authorizing Provider  amLODipine (NORVASC) 10 MG tablet Take 10 mg by mouth daily.    [provider]  amoxicillin-clavulanate (AUGMENTIN) 875-125 MG tablet Take 1 tablet by mouth every 12 (twelve) hours for 7 days. 04/02/20 04/09/20  Hall-Potvin, [place], PA-C    oxyCODONE-acetaminophen (ROXICET) 5-325 MG per tablet Take 1-2 tablets by mouth every 4 (four) hours as needed for pain. 12/06/12   Grier Mitts, PA-C  Vitamin D, Ergocalciferol, (DRISDOL) 50000 UNITS CAPS Take 50,000 Units by mouth every 7 (seven) days. Take on Mondays    [provider]    Family History History reviewed. No pertinent family history.  Social History Social History   Tobacco Use  . Smoking status: Never Smoker  . Smokeless tobacco: Never Used  Substance Use Topics  . Alcohol use: No  . Drug use: No     Allergies   Patient has no known allergies.   Review of Systems Review of Systems  Reason unable to perform ROS: See HPI as above.     Physical Exam Triage Vital Signs ED Triage Vitals  Enc Vitals Group     BP 04/09/20 0937 (!) 144/76     Pulse Rate 04/09/20 0937 73     Resp 04/09/20 0937 16     Temp 04/09/20 0937 98.3 F (36.8 C)     Temp Source 04/09/20 0937 Oral     SpO2 04/09/20 0937 98 %     Weight --      Height --      Head Circumference --      Peak Flow --      Pain Score 04/09/20 0939 0     Pain Loc --  Pain Edu? --      Excl. in [place]? --    No data found.  Updated Vital Signs BP (!) 144/76 (BP Location: Left Arm)   Pulse 73   Temp 98.3 F (36.8 C) (Oral)   Resp 16   SpO2 98%   Physical Exam Constitutional:      General: She is not in acute distress.    Appearance: Normal appearance. She is well-developed. She is not toxic-appearing or diaphoretic.  HENT:     Head: Normocephalic and atraumatic.  Eyes:     Conjunctiva/sclera: Conjunctivae normal.     Pupils: Pupils are equal, round, and reactive to light.  Pulmonary:     Effort: Pulmonary effort is normal. No respiratory distress.  Musculoskeletal:     Cervical back: Normal range of motion and neck supple.     Comments: Dried blood as well as clear yellow crusting along nailbed. No active drainage. No swelling, erythema, warmth. Decreased flexion of  the thumb due to pain. NVI  Skin:    General: Skin is warm and dry.  Neurological:     Mental Status: She is alert and oriented to person, place, and time.    UC Treatments / Results  Labs (all labs ordered are listed, but only abnormal results are displayed) Labs Reviewed - No data to display  EKG   Radiology No results found.  Procedures Procedures (including critical care time)  Medications Ordered in UC Medications - No data to display  Initial Impression / Assessment and Plan / UC Course  I have reviewed the triage vital signs and the nursing notes.  Pertinent labs & imaging results that were available during my care of the patient were reviewed by me and considered in my medical decision making (see chart for details).    No signs of infection. Sutures removed by RN. Patient still on augmentin, to finish course. Discussed wound care instructions. Finger splint as directed. Return precautions given.  Final Clinical Impressions(s) / UC Diagnoses   Final diagnoses:  Visit for suture removal  Visit for wound check    ED Prescriptions    None     PDMP not reviewed this encounter.   Ok Edwards, PA-C 04/09/20 1052

## 2020-04-09 NOTE — ED Triage Notes (Addendum)
Patient presents to [facility] for assessment of suture removal of left thumb after a crush injury 7 days ago.  Patient is concerned for infection and would like a provider to look at it.  Patient states follow-up appointment scheduled, but not for another week.

## 2020-04-16 DIAGNOSIS — M79645 Pain in left finger(s): Secondary | ICD-10-CM | POA: Diagnosis not present

## 2020-05-01 DIAGNOSIS — Z20822 Contact with and (suspected) exposure to covid-19: Secondary | ICD-10-CM | POA: Diagnosis not present

## 2020-05-15 ENCOUNTER — Ambulatory Visit
Admission: EM | Admit: 2020-05-15 | Discharge: 2020-05-15 | Disposition: A | Discharge status: Home / Self Care | Payer: Medicare HMO

## 2020-05-15 DIAGNOSIS — S61012D Laceration without foreign body of left thumb without damage to nail, subsequent encounter: Secondary | ICD-10-CM | POA: Diagnosis not present

## 2020-05-15 NOTE — ED Triage Notes (Signed)
Patient had injury and suture repair to left thumb 6 weeks ago, woke up today and suture site was bleeding. Patient denies new injury to area. No active bleeding noted.

## 2020-05-15 NOTE — ED Provider Notes (Signed)
[facility]    CSN: [id number] Arrival date & time: 05/15/20  0855      History   Chief Complaint Chief Complaint  Patient presents with  . Wound Check    HPI Christie Davis is a 74 y.o. female  With history of left thumb laceration presenting for wound check.  States that she woke up and it was bleeding this morning.  Has been doing well prior to this.  Sutures removed successfully at last visit 04/09/2020.  Denies anticoagulant/blood thinner use.  Bleeding controlled PTA with direct pressure.  Past Medical History:  Diagnosis Date  . Chronic cough    off and on-  . Hypertension   . Medical history non-contributory   . Wears glasses     There are no problems to display for this patient.   Past Surgical History:  Procedure Laterality Date  . COLONOSCOPY    . ORIF HUMERUS FRACTURE Right 12/06/2012   Procedure: OPEN REDUCTION INTERNAL FIXATION (ORIF) PROXIMAL HUMERUS FRACTURE;  Surgeon: Nita Sells, MD;  Location: [place];  Service: Orthopedics;  Laterality: Right;  . RECTAL TUMOR BY PROCTOTOMY EXCISION  2007  . TUBAL LIGATION      OB History   No obstetric history on file.      Home Medications    Prior to Admission medications   Medication Sig Start Date End Date Taking? Authorizing Provider  amLODipine (NORVASC) 10 MG tablet Take 10 mg by mouth daily.    [provider]  oxyCODONE-acetaminophen (ROXICET) 5-325 MG per tablet Take 1-2 tablets by mouth every 4 (four) hours as needed for pain. 12/06/12   Grier Mitts, PA-C  Vitamin D, Ergocalciferol, (DRISDOL) 50000 UNITS CAPS Take 50,000 Units by mouth every 7 (seven) days. Take on Mondays    [provider]    Family History History reviewed. No pertinent family history.  Social History Social History   Tobacco Use  . Smoking status: Never Smoker  . Smokeless tobacco: Never Used  Substance Use Topics  . Alcohol use: No  . Drug  use: No     Allergies   Patient has no known allergies.   Review of Systems As per HPI   Physical Exam Triage Vital Signs ED Triage Vitals  Enc Vitals Group     BP      Pulse      Resp      Temp      Temp src      SpO2      Weight      Height      Head Circumference      Peak Flow      Pain Score      Pain Loc      Pain Edu?      Excl. in [place]?    No data found.  Updated Vital Signs BP 137/74   Pulse 89   Temp 98.1 F (36.7 C)   Resp 16   SpO2 98%   Visual Acuity Right Eye Distance:   Left Eye Distance:   Bilateral Distance:    Right Eye Near:   Left Eye Near:    Bilateral Near:     Physical Exam Constitutional:      General: She is not in acute distress. HENT:     Head: Normocephalic and atraumatic.  Eyes:     General: No scleral icterus.    Pupils: Pupils are equal, round, and reactive to light.  Cardiovascular:     Rate and Rhythm: Normal rate.  Pulmonary:     Effort: Pulmonary effort is normal.  Musculoskeletal:        General: No swelling or tenderness. Normal range of motion.  Skin:    Coloration: Skin is not jaundiced or pale.     Comments: Left thumb with nailbed intact.  Neurovascularly intact.  Dried blood: No active bleeding, open wound.  Scar well-healed  Neurological:     Mental Status: She is alert and oriented to person, place, and time.      UC Treatments / Results  Labs (all labs ordered are listed, but only abnormal results are displayed) Labs Reviewed - No data to display  EKG   Radiology No results found.  Procedures Procedures (including critical care time)  Medications Ordered in UC Medications - No data to display  Initial Impression / Assessment and Plan / UC Course  I have reviewed the triage vital signs and the nursing notes.  Pertinent labs & imaging results that were available during my care of the patient were reviewed by me and considered in my medical decision making (see chart for details).      Patient has thumb guard: Encouraged her to use this at bedtime as she likely hit her thumb against something.  No active bleeding, open wound.  No further intervention required.  Return precautions discussed, pt verbalized understanding and is agreeable to plan. Final Clinical Impressions(s) / UC Diagnoses   Final diagnoses:  Thumb laceration, left, subsequent encounter     Discharge Instructions     Keep wound clean and dry!    ED Prescriptions    None     PDMP not reviewed this encounter.   Hall-Potvin, [place], [place] 05/15/20 1025

## 2020-05-15 NOTE — Discharge Instructions (Addendum)
Keep wound clean and dry.

## 2020-05-23 DIAGNOSIS — I10 Essential (primary) hypertension: Secondary | ICD-10-CM | POA: Diagnosis not present

## 2020-05-23 DIAGNOSIS — S60012S Contusion of left thumb without damage to nail, sequela: Secondary | ICD-10-CM | POA: Diagnosis not present

## 2020-05-23 DIAGNOSIS — L989 Disorder of the skin and subcutaneous tissue, unspecified: Secondary | ICD-10-CM | POA: Diagnosis not present

## 2020-05-23 DIAGNOSIS — R7303 Prediabetes: Secondary | ICD-10-CM | POA: Diagnosis not present

## 2020-05-30 DIAGNOSIS — M79645 Pain in left finger(s): Secondary | ICD-10-CM | POA: Diagnosis not present

## 2020-05-31 ENCOUNTER — Other Ambulatory Visit: Payer: Self-pay | Admitting: Family Medicine

## 2020-05-31 DIAGNOSIS — Z1231 Encounter for screening mammogram for malignant neoplasm of breast: Secondary | ICD-10-CM

## 2020-05-31 DIAGNOSIS — N6320 Unspecified lump in the left breast, unspecified quadrant: Secondary | ICD-10-CM

## 2020-06-11 ENCOUNTER — Other Ambulatory Visit: Payer: Self-pay | Admitting: Family Medicine

## 2020-06-11 DIAGNOSIS — N6320 Unspecified lump in the left breast, unspecified quadrant: Secondary | ICD-10-CM

## 2020-06-13 ENCOUNTER — Ambulatory Visit
Admission: RE | Admit: 2020-06-13 | Discharge: 2020-06-13 | Disposition: A | Discharge status: Home / Self Care | Payer: Medicare HMO | Source: Ambulatory Visit | Attending: Family Medicine | Admitting: Family Medicine

## 2020-06-13 ENCOUNTER — Other Ambulatory Visit: Payer: Self-pay

## 2020-06-13 ENCOUNTER — Other Ambulatory Visit: Payer: Self-pay | Admitting: Family Medicine

## 2020-06-13 DIAGNOSIS — N6320 Unspecified lump in the left breast, unspecified quadrant: Secondary | ICD-10-CM

## 2020-06-13 DIAGNOSIS — R928 Other abnormal and inconclusive findings on diagnostic imaging of breast: Secondary | ICD-10-CM | POA: Diagnosis not present

## 2020-06-13 DIAGNOSIS — N6012 Diffuse cystic mastopathy of left breast: Secondary | ICD-10-CM | POA: Diagnosis not present

## 2020-06-18 DIAGNOSIS — R69 Illness, unspecified: Secondary | ICD-10-CM | POA: Diagnosis not present

## 2020-06-29 DIAGNOSIS — S63501A Unspecified sprain of right wrist, initial encounter: Secondary | ICD-10-CM | POA: Diagnosis not present

## 2020-06-29 DIAGNOSIS — S0181XA Laceration without foreign body of other part of head, initial encounter: Secondary | ICD-10-CM | POA: Diagnosis not present

## 2020-07-09 DIAGNOSIS — S6991XD Unspecified injury of right wrist, hand and finger(s), subsequent encounter: Secondary | ICD-10-CM | POA: Diagnosis not present

## 2020-07-09 DIAGNOSIS — Z4802 Encounter for removal of sutures: Secondary | ICD-10-CM | POA: Diagnosis not present

## 2020-07-09 DIAGNOSIS — S60112D Contusion of left thumb with damage to nail, subsequent encounter: Secondary | ICD-10-CM | POA: Diagnosis not present

## 2020-07-09 DIAGNOSIS — S0181XD Laceration without foreign body of other part of head, subsequent encounter: Secondary | ICD-10-CM | POA: Diagnosis not present

## 2020-08-31 DIAGNOSIS — L300 Nummular dermatitis: Secondary | ICD-10-CM | POA: Diagnosis not present

## 2020-08-31 DIAGNOSIS — L819 Disorder of pigmentation, unspecified: Secondary | ICD-10-CM | POA: Diagnosis not present

## 2020-08-31 DIAGNOSIS — D485 Neoplasm of uncertain behavior of skin: Secondary | ICD-10-CM | POA: Diagnosis not present

## 2020-08-31 DIAGNOSIS — L980 Pyogenic granuloma: Secondary | ICD-10-CM | POA: Diagnosis not present

## 2020-10-18 ENCOUNTER — Other Ambulatory Visit: Payer: Self-pay | Admitting: Family Medicine

## 2020-10-18 DIAGNOSIS — Z0000 Encounter for general adult medical examination without abnormal findings: Secondary | ICD-10-CM | POA: Diagnosis not present

## 2020-10-18 DIAGNOSIS — M8580 Other specified disorders of bone density and structure, unspecified site: Secondary | ICD-10-CM

## 2020-10-18 DIAGNOSIS — E785 Hyperlipidemia, unspecified: Secondary | ICD-10-CM | POA: Diagnosis not present

## 2020-10-18 DIAGNOSIS — E559 Vitamin D deficiency, unspecified: Secondary | ICD-10-CM | POA: Diagnosis not present

## 2020-10-18 DIAGNOSIS — R7303 Prediabetes: Secondary | ICD-10-CM | POA: Diagnosis not present

## 2020-10-18 DIAGNOSIS — M8588 Other specified disorders of bone density and structure, other site: Secondary | ICD-10-CM | POA: Diagnosis not present

## 2020-10-18 DIAGNOSIS — I10 Essential (primary) hypertension: Secondary | ICD-10-CM | POA: Diagnosis not present

## 2020-10-18 DIAGNOSIS — Z23 Encounter for immunization: Secondary | ICD-10-CM | POA: Diagnosis not present

## 2020-11-20 DIAGNOSIS — Z20822 Contact with and (suspected) exposure to covid-19: Secondary | ICD-10-CM | POA: Diagnosis not present

## 2020-12-10 DIAGNOSIS — H401122 Primary open-angle glaucoma, left eye, moderate stage: Secondary | ICD-10-CM | POA: Diagnosis not present

## 2020-12-10 DIAGNOSIS — H524 Presbyopia: Secondary | ICD-10-CM | POA: Diagnosis not present

## 2020-12-10 DIAGNOSIS — H25013 Cortical age-related cataract, bilateral: Secondary | ICD-10-CM | POA: Diagnosis not present

## 2020-12-10 DIAGNOSIS — H43813 Vitreous degeneration, bilateral: Secondary | ICD-10-CM | POA: Diagnosis not present

## 2021-04-05 DIAGNOSIS — Z8249 Family history of ischemic heart disease and other diseases of the circulatory system: Secondary | ICD-10-CM | POA: Diagnosis not present

## 2021-04-05 DIAGNOSIS — H409 Unspecified glaucoma: Secondary | ICD-10-CM | POA: Diagnosis not present

## 2021-04-05 DIAGNOSIS — I10 Essential (primary) hypertension: Secondary | ICD-10-CM | POA: Diagnosis not present

## 2021-04-05 DIAGNOSIS — Z809 Family history of malignant neoplasm, unspecified: Secondary | ICD-10-CM | POA: Diagnosis not present

## 2021-04-05 DIAGNOSIS — Z7722 Contact with and (suspected) exposure to environmental tobacco smoke (acute) (chronic): Secondary | ICD-10-CM | POA: Diagnosis not present

## 2021-04-05 DIAGNOSIS — Z823 Family history of stroke: Secondary | ICD-10-CM | POA: Diagnosis not present

## 2021-04-05 DIAGNOSIS — E669 Obesity, unspecified: Secondary | ICD-10-CM | POA: Diagnosis not present

## 2021-04-05 DIAGNOSIS — Z6833 Body mass index (BMI) 33.0-33.9, adult: Secondary | ICD-10-CM | POA: Diagnosis not present

## 2021-04-16 DIAGNOSIS — H401122 Primary open-angle glaucoma, left eye, moderate stage: Secondary | ICD-10-CM | POA: Diagnosis not present

## 2021-04-16 DIAGNOSIS — H401111 Primary open-angle glaucoma, right eye, mild stage: Secondary | ICD-10-CM | POA: Diagnosis not present

## 2021-04-17 DIAGNOSIS — M25511 Pain in right shoulder: Secondary | ICD-10-CM | POA: Diagnosis not present

## 2021-04-17 DIAGNOSIS — I10 Essential (primary) hypertension: Secondary | ICD-10-CM | POA: Diagnosis not present

## 2021-04-17 DIAGNOSIS — K649 Unspecified hemorrhoids: Secondary | ICD-10-CM | POA: Diagnosis not present

## 2021-06-26 ENCOUNTER — Other Ambulatory Visit: Payer: Self-pay | Admitting: Family Medicine

## 2021-06-26 DIAGNOSIS — Z1231 Encounter for screening mammogram for malignant neoplasm of breast: Secondary | ICD-10-CM

## 2021-07-01 ENCOUNTER — Other Ambulatory Visit: Payer: Self-pay

## 2021-07-01 ENCOUNTER — Ambulatory Visit
Admission: RE | Admit: 2021-07-01 | Discharge: 2021-07-01 | Disposition: A | Discharge status: Home / Self Care | Payer: Medicare HMO | Source: Ambulatory Visit | Attending: Family Medicine | Admitting: Family Medicine

## 2021-07-01 DIAGNOSIS — M8589 Other specified disorders of bone density and structure, multiple sites: Secondary | ICD-10-CM | POA: Diagnosis not present

## 2021-07-01 DIAGNOSIS — Z780 Asymptomatic menopausal state: Secondary | ICD-10-CM | POA: Diagnosis not present

## 2021-07-01 DIAGNOSIS — M8580 Other specified disorders of bone density and structure, unspecified site: Secondary | ICD-10-CM

## 2021-07-30 ENCOUNTER — Ambulatory Visit
Admission: RE | Admit: 2021-07-30 | Discharge: 2021-07-30 | Disposition: A | Discharge status: Home / Self Care | Payer: Medicare HMO | Source: Ambulatory Visit | Attending: Family Medicine | Admitting: Family Medicine

## 2021-07-30 DIAGNOSIS — Z1231 Encounter for screening mammogram for malignant neoplasm of breast: Secondary | ICD-10-CM

## 2021-08-01 ENCOUNTER — Other Ambulatory Visit: Payer: Self-pay | Admitting: Family Medicine

## 2021-08-01 DIAGNOSIS — R928 Other abnormal and inconclusive findings on diagnostic imaging of breast: Secondary | ICD-10-CM

## 2021-10-14 DIAGNOSIS — H25013 Cortical age-related cataract, bilateral: Secondary | ICD-10-CM | POA: Diagnosis not present

## 2021-10-14 DIAGNOSIS — H524 Presbyopia: Secondary | ICD-10-CM | POA: Diagnosis not present

## 2021-10-14 DIAGNOSIS — H43813 Vitreous degeneration, bilateral: Secondary | ICD-10-CM | POA: Diagnosis not present

## 2021-10-14 DIAGNOSIS — H401122 Primary open-angle glaucoma, left eye, moderate stage: Secondary | ICD-10-CM | POA: Diagnosis not present

## 2021-10-23 ENCOUNTER — Encounter: Payer: Self-pay | Admitting: Orthopedic Surgery

## 2021-10-23 ENCOUNTER — Other Ambulatory Visit: Payer: Self-pay

## 2021-10-23 ENCOUNTER — Ambulatory Visit (INDEPENDENT_AMBULATORY_CARE_PROVIDER_SITE_OTHER): Payer: Medicare HMO

## 2021-10-23 ENCOUNTER — Ambulatory Visit (INDEPENDENT_AMBULATORY_CARE_PROVIDER_SITE_OTHER): Payer: No Typology Code available for payment source | Admitting: Orthopedic Surgery

## 2021-10-23 ENCOUNTER — Ambulatory Visit
Admission: EM | Admit: 2021-10-23 | Discharge: 2021-10-23 | Disposition: A | Discharge status: Home / Self Care | Payer: Medicare HMO

## 2021-10-23 DIAGNOSIS — S82839A Other fracture of upper and lower end of unspecified fibula, initial encounter for closed fracture: Secondary | ICD-10-CM

## 2021-10-23 DIAGNOSIS — S82402A Unspecified fracture of shaft of left fibula, initial encounter for closed fracture: Secondary | ICD-10-CM | POA: Diagnosis not present

## 2021-10-23 DIAGNOSIS — M7989 Other specified soft tissue disorders: Secondary | ICD-10-CM | POA: Diagnosis not present

## 2021-10-23 DIAGNOSIS — W19XXXA Unspecified fall, initial encounter: Secondary | ICD-10-CM

## 2021-10-23 DIAGNOSIS — S82892A Other fracture of left lower leg, initial encounter for closed fracture: Secondary | ICD-10-CM | POA: Diagnosis not present

## 2021-10-23 NOTE — Discharge Instructions (Addendum)
You have a fracture of your left ankle.  A splint has been applied in urgent care.  Crutches have also been supplied.  Please do not bear any weight until otherwise advised by orthopedist.  Follow-up with orthopedist for further evaluation and management.  You may apply ice application to area as well.  Elevate extremity.

## 2021-10-23 NOTE — ED Triage Notes (Signed)
Pt c/o left ankle injury she was walking and her shoe slipped forward. States the pain was immediate, worse when she walks on it, dull, edematous. Onset yesterday early afternoon. Pt able to move ankle and toes, denies tingling or numbness.

## 2021-10-23 NOTE — ED Provider Notes (Signed)
[facility]    CSN: [id number] Arrival date & time: 10/23/21  0915      History   Chief Complaint Chief Complaint  Patient presents with   left ankle injury    HPI Christie Davis is a 76 y.o. female.   Patient presents with left ankle pain that started yesterday after injury.  Patient reports that she slipped on wet ground and her ankle twisted inward at the time.  Denies hitting head or losing consciousness during fall.  Is able to bear weight but with pain.  Denies any numbness or tingling.  Patient does not report taking medications for pain just yet.    Past Medical History:  Diagnosis Date   Chronic cough    off and on-   Hypertension    Medical history non-contributory    Wears glasses     There are no problems to display for this patient.   Past Surgical History:  Procedure Laterality Date   COLONOSCOPY     ORIF HUMERUS FRACTURE Right 12/06/2012   Procedure: OPEN REDUCTION INTERNAL FIXATION (ORIF) PROXIMAL HUMERUS FRACTURE;  Surgeon: Nita Sells, MD;  Location: [place];  Service: Orthopedics;  Laterality: Right;   RECTAL TUMOR BY PROCTOTOMY EXCISION  2007   TUBAL LIGATION      OB History   No obstetric history on file.      Home Medications    Prior to Admission medications   Medication Sig Start Date End Date Taking? Authorizing Provider  amLODipine (NORVASC) 10 MG tablet Take 10 mg by mouth daily.    [provider]  latanoprost (XALATAN) 0.005 % ophthalmic solution 1 drop at bedtime. 08/21/21   [provider]  oxyCODONE-acetaminophen (ROXICET) 5-325 MG per tablet Take 1-2 tablets by mouth every 4 (four) hours as needed for pain. 12/06/12   Grier Mitts, PA-C  Vitamin D, Ergocalciferol, (DRISDOL) 50000 UNITS CAPS Take 50,000 Units by mouth every 7 (seven) days. Take on Mondays    [provider]    Family History History reviewed. No pertinent family  history.  Social History Social History   Tobacco Use   Smoking status: Never   Smokeless tobacco: Never  Substance Use Topics   Alcohol use: No   Drug use: No     Allergies   Patient has no known allergies.   Review of Systems Review of Systems Per HPI  Physical Exam Triage Vital Signs ED Triage Vitals  Enc Vitals Group     BP 10/23/21 1002 137/85     Pulse Rate 10/23/21 1002 95     Resp 10/23/21 1002 18     Temp 10/23/21 1002 98.2 F (36.8 C)     Temp Source 10/23/21 1002 Oral     SpO2 10/23/21 1002 98 %     Weight --      Height --      Head Circumference --      Peak Flow --      Pain Score 10/23/21 1003 0     Pain Loc --      Pain Edu? --      Excl. in [place]? --    No data found.  Updated Vital Signs BP 137/85 (BP Location: Left Arm)    Pulse 95    Temp 98.2 F (36.8 C) (Oral)    Resp 18    SpO2 98%   Visual Acuity Right Eye Distance:   Left Eye Distance:   Bilateral  Distance:    Right Eye Near:   Left Eye Near:    Bilateral Near:     Physical Exam Constitutional:      General: She is not in acute distress.    Appearance: Normal appearance. She is not toxic-appearing or diaphoretic.  HENT:     Head: Normocephalic and atraumatic.  Eyes:     Extraocular Movements: Extraocular movements intact.     Conjunctiva/sclera: Conjunctivae normal.  Pulmonary:     Effort: Pulmonary effort is normal.  Musculoskeletal:     Right ankle: Normal.     Left ankle: Swelling present. No deformity. Tenderness present. Decreased range of motion. Normal pulse.     Left Achilles Tendon: Normal.     Comments: Tenderness to palpation with moderate swelling noted to left lateral malleolus.  Limited range of motion due to pain and swelling.  No pain to foot.  Neurovascular intact.  She does have area of swelling to dorsal surface of left foot at first metatarsal but patient reports that this is baseline prior to injury.  Neurological:     General: No focal deficit  present.     Mental Status: She is alert and oriented to person, place, and time. Mental status is at baseline.  Psychiatric:        Mood and Affect: Mood normal.        Behavior: Behavior normal.        Thought Content: Thought content normal.        Judgment: Judgment normal.     UC Treatments / Results  Labs (all labs ordered are listed, but only abnormal results are displayed) Labs Reviewed - No data to display  EKG   Radiology DG Ankle Complete Left  Result Date: 10/23/2021 CLINICAL DATA:  Fall in parking lot. EXAM: LEFT ANKLE COMPLETE - 3+ VIEW COMPARISON:  None. FINDINGS: There is an acute, obliquely oriented, intra-articular fracture deformity involving the distal fibula. There is a well corticated os ossific density adjacent to the medial malleolus which may represent sequelae of remote trauma or ossicle. No signs of dislocation. Plantar heel spur identified. Lateral soft tissue swelling. IMPRESSION: 1. Acute, obliquely oriented, intra-articular fracture deformity involving the distal fibula. 2. Plantar heel spur. Electronically Signed   By: Kerby Moors M.D.   On: 10/23/2021 10:18    Procedures Procedures (including critical care time)  Medications Ordered in UC Medications - No data to display  Initial Impression / Assessment and Plan / UC Course  I have reviewed the triage vital signs and the nursing notes.  Pertinent labs & imaging results that were available during my care of the patient were reviewed by me and considered in my medical decision making (see chart for details).     Patient has distal fibula fracture.  I do not have adequate splinting supplies for posterior leg splint in urgent care.  Offered for patient to go to affiliated [facility] urgent care for [employer] to place splint but patient declined.  Placed cam boot in urgent care for immobilization as this should be adequate as well.  Crutches supplied.  Advised patient of nonweightbearing until  otherwise advised by orthopedist.  Patient will need to follow-up with orthopedist in the next few days.  Provided patient with contact information for orthopedist.  Discussed ice elevation and pain management with patient.  Patient verbalized understanding and was agreeable with plan. Final Clinical Impressions(s) / UC Diagnoses   Final diagnoses:  Closed fracture of distal end of fibula,  unspecified fracture morphology, initial encounter     Discharge Instructions      You have a fracture of your left ankle.  A splint has been applied in urgent care.  Crutches have also been supplied.  Please do not bear any weight until otherwise advised by orthopedist.  Follow-up with orthopedist for further evaluation and management.  You may apply ice application to area as well.  Elevate extremity.    ED Prescriptions   None    I have reviewed the PDMP during this encounter.   Teodora Medici, [place] 10/23/21 1049

## 2021-10-24 ENCOUNTER — Encounter: Payer: Self-pay | Admitting: Orthopedic Surgery

## 2021-10-24 NOTE — Progress Notes (Signed)
Office Visit Note   Patient: Christie Davis           Date of Birth: October 08, 1945           MRN: [id number] Visit Date: 10/23/2021 Requested by: Harlan Stains, MD [address],  [place] [id number] PCP: Harlan Stains, MD  Subjective: Chief Complaint  Patient presents with   Left Ankle - New Patient (Initial Visit)    HPI: Christie Davis is a 76 year old patient with left ankle pain.  She slipped on the mud the day before her current clinic appointment.  Went to urgent care on the morning of her visit and she was given a boot and crutches.  No prior injury no prior surgery to the left ankle.  Has a history of hypertension but no personal or family history of DVT or pulmonary embolism.  She does have a history of osteopenia.  She is a Film/video editor.  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  Likes to walk about 30 minutes a day.  Has been taking Tylenol for pain.  She is here with her husband.              ROS: All systems reviewed are negative as they relate to the chief complaint within the history of present illness.  Patient denies  fevers or chills.   Assessment & Plan: Visit Diagnoses:  1. Closed fracture of left ankle, initial encounter     Plan: Impression is left ankle pain with lateral malleolar fracture which is minimally displaced.  Plan at this time is short leg cast with return office visit in 1 week for repeat radiographs out of the cast.  Baby aspirin 81 mg a day for 6 weeks.  Plan also to see Christie Davis's husband when she comes back next week as he is having some right knee pain.  If the fracture shifts we may need to consider surgical intervention but currently the mortise is symmetric and stable with no medial clear space widening and there is only 2 to 3 mm of shortening of the fibular malleolus at this time.  Follow-Up Instructions: Return in about 1 week (around 10/30/2021).   Orders:  No orders of the defined types were placed in this  encounter.  No orders of the defined types were placed in this encounter.     Procedures: No procedures performed   Clinical Data: No additional findings.  Objective: Vital Signs: There were no vitals taken for this visit.  Physical Exam:   Constitutional: Patient appears well-developed HEENT:  Head: Normocephalic Eyes:EOM are normal Neck: Normal range of motion Cardiovascular: Normal rate Pulmonary/chest: Effort normal Neurologic: Patient is alert Skin: Skin is warm Psychiatric: Patient has normal mood and affect   Ortho Exam: Ortho exam demonstrates full active and passive range of motion of the knee with no effusion on the left.  No calf tenderness negative Homans.  Patient has lateral sided swelling but no medial sided tenderness or swelling.  Both feet are perfused and sensate.Christie Davis moves the foot and ankle around with relative ease.  She has been getting around in a fracture boot since the injury yesterday.  Specialty Comments:  No specialty comments available.  Imaging: DG Ankle Complete Left  Result Date: 10/23/2021 CLINICAL DATA:  Fall in parking lot. EXAM: LEFT ANKLE COMPLETE - 3+ VIEW COMPARISON:  None. FINDINGS: There is an acute, obliquely oriented, intra-articular fracture deformity involving the distal fibula. There is a well corticated  os ossific density adjacent to the medial malleolus which may represent sequelae of remote trauma or ossicle. No signs of dislocation. Plantar heel spur identified. Lateral soft tissue swelling. IMPRESSION: 1. Acute, obliquely oriented, intra-articular fracture deformity involving the distal fibula. 2. Plantar heel spur. Electronically Signed   By: Kerby Moors M.D.   On: 10/23/2021 10:18     PMFS History: There are no problems to display for this patient.  Past Medical History:  Diagnosis Date   Chronic cough    off and on-   Hypertension    Medical history non-contributory    Wears glasses     History reviewed. No  pertinent family history.  Past Surgical History:  Procedure Laterality Date   COLONOSCOPY     ORIF HUMERUS FRACTURE Right 12/06/2012   Procedure: OPEN REDUCTION INTERNAL FIXATION (ORIF) PROXIMAL HUMERUS FRACTURE;  Surgeon: Nita Sells, MD;  Location: [place];  Service: Orthopedics;  Laterality: Right;   RECTAL TUMOR BY PROCTOTOMY EXCISION  2007   TUBAL LIGATION     Social History   Occupational History   Not on file  Tobacco Use   Smoking status: Never   Smokeless tobacco: Never  Substance and Sexual Activity   Alcohol use: No   Drug use: No   Sexual activity: Not on file

## 2021-10-28 DIAGNOSIS — I10 Essential (primary) hypertension: Secondary | ICD-10-CM | POA: Diagnosis not present

## 2021-10-28 DIAGNOSIS — S82892D Other fracture of left lower leg, subsequent encounter for closed fracture with routine healing: Secondary | ICD-10-CM | POA: Diagnosis not present

## 2021-10-28 DIAGNOSIS — Z23 Encounter for immunization: Secondary | ICD-10-CM | POA: Diagnosis not present

## 2021-10-28 DIAGNOSIS — R002 Palpitations: Secondary | ICD-10-CM | POA: Diagnosis not present

## 2021-10-28 DIAGNOSIS — Z0000 Encounter for general adult medical examination without abnormal findings: Secondary | ICD-10-CM | POA: Diagnosis not present

## 2021-10-28 DIAGNOSIS — R7303 Prediabetes: Secondary | ICD-10-CM | POA: Diagnosis not present

## 2021-10-28 DIAGNOSIS — M792 Neuralgia and neuritis, unspecified: Secondary | ICD-10-CM | POA: Diagnosis not present

## 2021-10-28 DIAGNOSIS — E785 Hyperlipidemia, unspecified: Secondary | ICD-10-CM | POA: Diagnosis not present

## 2021-10-28 DIAGNOSIS — E559 Vitamin D deficiency, unspecified: Secondary | ICD-10-CM | POA: Diagnosis not present

## 2021-10-31 ENCOUNTER — Other Ambulatory Visit: Payer: Self-pay

## 2021-10-31 ENCOUNTER — Ambulatory Visit (INDEPENDENT_AMBULATORY_CARE_PROVIDER_SITE_OTHER): Payer: No Typology Code available for payment source

## 2021-10-31 ENCOUNTER — Ambulatory Visit: Payer: Medicare HMO | Admitting: Orthopedic Surgery

## 2021-10-31 DIAGNOSIS — S82892A Other fracture of left lower leg, initial encounter for closed fracture: Secondary | ICD-10-CM

## 2021-11-01 ENCOUNTER — Ambulatory Visit: Payer: Medicare HMO | Admitting: Surgical

## 2021-11-01 ENCOUNTER — Encounter: Payer: Self-pay | Admitting: Orthopedic Surgery

## 2021-11-01 NOTE — Progress Notes (Signed)
° °  Post-Op Visit Note   Patient: Christie Davis           Date of Birth: 10/19/1945           MRN: [id number] Visit Date: 10/31/2021 PCP: Harlan Stains, MD   Assessment & Plan:  Chief Complaint:  Chief Complaint  Patient presents with   Left Ankle - Fracture, Follow-up   Visit Diagnoses:  1. Closed fracture of left ankle, initial encounter     Plan: Christie Davis is a patient with left ankle fracture.  Date of injury 10/22/2021.  She was casted last week.  On aspirin for DVT prophylaxis.  She works as a Writer.  On exam the cast feels good.  Radiographs show no change in fracture alignment.  Plan is to continue using knee scooter.  Physical therapy for upper body strengthening and gait training 2 times a week for 2 weeks.  Follow-up in 2 weeks with cast removal and initiation of ankle range of motion and possibly weightbearing in a fracture boot at that time  Follow-Up Instructions: Return in about 2 weeks (around 11/14/2021).   Orders:  Orders Placed This Encounter  Procedures   XR Ankle Complete Left   No orders of the defined types were placed in this encounter.   Imaging: No results found.  PMFS History: There are no problems to display for this patient.  Past Medical History:  Diagnosis Date   Chronic cough    off and on-   Hypertension    Medical history non-contributory    Wears glasses     History reviewed. No pertinent family history.  Past Surgical History:  Procedure Laterality Date   COLONOSCOPY     ORIF HUMERUS FRACTURE Right 12/06/2012   Procedure: OPEN REDUCTION INTERNAL FIXATION (ORIF) PROXIMAL HUMERUS FRACTURE;  Surgeon: Nita Sells, MD;  Location: [place];  Service: Orthopedics;  Laterality: Right;   RECTAL TUMOR BY PROCTOTOMY EXCISION  2007   TUBAL LIGATION     Social History   Occupational History   Not on file  Tobacco Use   Smoking status: Never   Smokeless tobacco: Never  Substance and Sexual Activity    Alcohol use: No   Drug use: No   Sexual activity: Not on file

## 2021-11-04 NOTE — Therapy (Signed)
OUTPATIENT PHYSICAL THERAPY EVALUATION   Patient Name: Christie Davis MRN: [id number] DOB:08-23-1946, 76 y.o., female Today's Date: 11/06/2021   PT End of Session - 11/06/21 1500     Visit Number 1    Number of Visits 20    Date for PT Re-Evaluation 01/15/22    Authorization Type [employer] $35 copay    Progress Note Due on Visit 10    PT Start Time 1500    PT Stop Time 1553    PT Time Calculation (min) 53 min    Activity Tolerance Patient tolerated treatment well    Behavior During Therapy WFL for tasks assessed/performed             Past Medical History:  Diagnosis Date   Chronic cough    off and on-   Hypertension    Medical history non-contributory    Wears glasses    Past Surgical History:  Procedure Laterality Date   COLONOSCOPY     ORIF HUMERUS FRACTURE Right 12/06/2012   Procedure: OPEN REDUCTION INTERNAL FIXATION (ORIF) PROXIMAL HUMERUS FRACTURE;  Surgeon: Nita Sells, MD;  Location: [place];  Service: Orthopedics;  Laterality: Right;   RECTAL TUMOR BY PROCTOTOMY EXCISION  2007   TUBAL LIGATION     There are no problems to display for this patient.   PCP: Harlan Stains, MD  REFERRING PROVIDER: Meredith Pel, MD  REFERRING DIAG: [phone] (ICD-10-CM) - Closed fracture of left ankle, initial encounter  THERAPY DIAG:  Pain in left ankle and joints of left foot  Muscle weakness (generalized)  Difficulty in walking, not elsewhere classified  ONSET DATE: 10/22/2021  SUBJECTIVE:   SUBJECTIVE STATEMENT: Patient indicated she had gone to work and was walking outside on a rainy day and she slipped and felt on Lt ankle.  Saw ED next day and been in cast since.  Patient indicated no weight on Lt leg and Rt knee has been hurting some due to increased pressure.  Has knee scooter.  Patient indicated having crutches but can't use them due to weakness.  Has a wheel chair as well.   PERTINENT HISTORY: HTN.  Injury  10/22/2021 with ED visit on 10/23/2021  PAIN:  Are you having pain? Yes NPRS scale: current:  5/10   at worst in last week: 7/10 at best 0/10 Pain location: ankle/foot Pain orientation: Left  PAIN TYPE: acute Pain description: ache Aggravating factors: nighttime ache  Relieving factors: elevated   PRECAUTIONS: Other: in cast  WEIGHT BEARING RESTRICTIONS Yes NWB Lt leg  FALLS:  Has patient fallen in last 6 months? Yes, Number of falls: 1   LIVING ENVIRONMENT: Lives with: lives with their family Lives in: House/apartment Stairs: 2 story house (bedroom on 2nd floor) with rails on both sides before landing then 1 rail for second part.  4 stairs to enter with bilateral hand rails reported (maybe able to touch).  Scooting on bottom.  Has following equipment at home: axillary crutches, wheelchair, knee scooter  OCCUPATION: Tutoring, 4x week 7 hours per day.  Returned to some work this week.  PLOF: Independent, walking in neighborhood, housework   PATIENT GOALS Reduce pain, get back to normal routine   OBJECTIVE:     PATIENT SURVEYS:  11/06/2021: FOTO Ankle intake: 8   predicted:  50  COGNITION:  11/06/2021: Overall cognitive status: Within functional limits for tasks assessed    PALPATION: 11/06/2021: unable to palpate (cast on)  LE AROM/PROM:  A/PROM Right 11/06/2021  Left 11/06/2021  Hip flexion    Hip extension    Hip abduction    Hip adduction    Hip internal rotation    Hip external rotation    Knee flexion    Knee extension    Ankle dorsiflexion    Ankle plantarflexion    Ankle inversion    Ankle eversion     (Blank rows = not tested)  LE MMT:  MMT Right 11/06/2021 Left 11/06/2021  Hip flexion 5/5 4/5  Hip extension    Hip abduction    Hip adduction    Hip internal rotation    Hip external rotation    Knee flexion 5/5 4/5  Knee extension 5/5 4/5  Ankle dorsiflexion 5/5   Ankle plantarflexion    Ankle inversion 5/5   Ankle eversion 5/5    (Blank  rows = not tested)  UE MMT: MMT Right 11/06/2021 Left 11/06/2021  shoulder flexion 4+/5 5/5  shoulder extension    shoulder abduction 4/5 5/5  shoulder adduction    shoulder internal rotation 5/5 5/5  shoulder external rotation 4/5 5/5  elbow flexion 5/5 5/5  elbow extension 5/5 5/5                    LOWER EXTREMITY SPECIAL TESTS:  11/06/2021: none performed today  FUNCTIONAL TESTS:  11/06/2021: non performed today  GAIT: 11/06/2021:  Distance walked: 15 ft Assistive device utilized: FWW Level of assistance: CGA to Min A Comments: step to gait pattern c NWB on Lt leg    TODAY'S TREATMENT: 11/06/2021:   Therex:  HEP instruction/performance c cues for techniques, handout provided.  Trial set performed of each for comprehension and symptom assessment.  See below for exercise list.  Gait training: CGA to min A occasionally during training.  Education with verbal, visual and tactile cues for sequencing and positioning for ambulation c FWW.  Performed 15 ft, 10 ft.  Attempted axillary crutch use bilaterally c CGA to min A 5 ft with poor control noted and instability.  Cues given throughout to avoid WB on Lt leg.  Additional time spent in review of techniques during performance and in pre and post performance.  More education and trial required.    TherActivity: Cues verbally, visually given for sit to stand to sit transfers from various table heights (between 17 and 20 inch heights).  Focus on use of UE on sitting surface to generate movement vs use of UE on assistive devices.  Trials given totaling 10-12x with improved performance noted during course of trial.     PATIENT EDUCATION:  11/06/2021:  Education details: HEP, POC Person educated: Patient Education method: Consulting civil engineer, Demonstration, Verbal cues, and Handouts Education comprehension: verbalized understanding and returned demonstration   HOME EXERCISE PROGRAM: 11/06/2021: Access Code: [email or web address] URL:  [email or web address] Date: 11/06/2021 Prepared by: Scot Jun  Exercises Seated Chair Push Ups - 1-2 x daily - 7 x weekly - 3 sets - 10 reps Seated Straight Leg Heel Taps - 1-2 x daily - 7 x weekly - 3 sets - 10 reps   ASSESSMENT:  CLINICAL IMPRESSION: Patient is a 76 y.o. who comes to clinic with complaints of Lt ankle pain s/p closed fracture with mobility, strength and movement coordination deficits that impair their ability to perform usual daily and recreational functional activities without increase difficulty/symptoms at this time.  Patient to benefit from skilled PT services to address impairments and limitations to improve to previous level of function without restriction secondary  to condition. Objective impairments include Abnormal gait, decreased activity tolerance, decreased balance, decreased coordination, decreased endurance, decreased mobility, difficulty walking, decreased ROM, decreased strength, hypomobility, increased edema, impaired flexibility, improper body mechanics, postural dysfunction, and pain. These impairments are limiting patient from cleaning, community activity, driving, meal prep, occupation, laundry, and shopping.  Patient will benefit from skilled PT to address above impairments and improve overall function.  REHAB POTENTIAL: Good  CLINICAL DECISION MAKING: Stable/uncomplicated  EVALUATION COMPLEXITY: Low   GOALS: Goals reviewed with patient? Yes  SHORT TERM GOALS:  STG Name Target Date Goal status  1 Patient will demonstrate independent use of home exercise program to maintain progress from in clinic treatments.  11/27/2021 INITIAL  2 Patient will demonstrate modified independent ambulation c LRAD community distances (pending WB changes) 11/27/2021 Initial  3  Patient will demonstrate safe ascending/descending stairs c use of assistive device and rails to facilitate household navigation.  11/27/2021 Initial                        LONG TERM GOALS:   LTG Name Target Date Goal status  1 Patient will demonstrate/report pain at worst less than or equal to 2/10 to facilitate minimal limitation in daily activity secondary to pain symptoms.  01/15/2022 INITIAL  2 Patient will demonstrate independent use of home exercise program to facilitate ability to maintain/progress functional gains from skilled physical therapy services.  01/15/2022 INITIAL  3 Patient will demonstrate FOTO outcome > or = 50% to indicated reduced disability due to condition. 01/15/2022 INITIAL  4 Patient will demonstrate Lt knee MMT 5/5, ankle 5/5 throughout to facilitate ability to perform usual standing, walking, stairs at PLOF s limitation due to symptoms. 01/15/2022 INITIAL  5 Patient will demonstrate independent ambulation community distances > 300 ft to return to normal (pending WB restrictions) 01/15/2022 INITIAL  6 Patient will demonstrate reciprocal gait pattern up/down stairs for household navigation.  01/15/2022 INITIAL  7 Plan to add goal for Lt ankle mobility once cast removed.      PLAN: PT FREQUENCY: 2x/week  PT DURATION: 10 weeks  PLANNED INTERVENTIONS: Therapeutic exercises, Therapeutic activity, Neuro Muscular re-education, Balance training, Gait training, Patient/Family education, Joint mobilization, Stair training, DME instructions, Dry Needling, Electrical stimulation, Cryotherapy, Moist heat, Taping, Ultrasound, Ionotophoresis 4mg /ml Dexamethasone, and Manual therapy.  All included unless contraindicated  PLAN FOR NEXT SESSION: Review HEP, FWW/axillary crutch ambulation training, stair training for use of assistive device and rail for home. UE strengthening   Scot Jun, PT, DPT, OCS, ATC 11/06/21  4:25 PM

## 2021-11-04 NOTE — Addendum Note (Signed)
Addended byLaurann Montana on: 11/04/2021 09:52 AM   Modules accepted: Orders

## 2021-11-06 ENCOUNTER — Ambulatory Visit (INDEPENDENT_AMBULATORY_CARE_PROVIDER_SITE_OTHER): Payer: Medicare HMO | Admitting: Rehabilitative and Restorative Service Providers"

## 2021-11-06 ENCOUNTER — Encounter: Payer: Self-pay | Admitting: Rehabilitative and Restorative Service Providers"

## 2021-11-06 ENCOUNTER — Other Ambulatory Visit: Payer: Self-pay

## 2021-11-06 DIAGNOSIS — M6281 Muscle weakness (generalized): Secondary | ICD-10-CM | POA: Diagnosis not present

## 2021-11-06 DIAGNOSIS — R262 Difficulty in walking, not elsewhere classified: Secondary | ICD-10-CM

## 2021-11-06 DIAGNOSIS — M25572 Pain in left ankle and joints of left foot: Secondary | ICD-10-CM

## 2021-11-07 ENCOUNTER — Encounter: Payer: Medicare HMO | Admitting: Rehabilitative and Restorative Service Providers"

## 2021-11-11 ENCOUNTER — Ambulatory Visit (INDEPENDENT_AMBULATORY_CARE_PROVIDER_SITE_OTHER): Payer: Medicare HMO | Admitting: Physical Therapy

## 2021-11-11 ENCOUNTER — Encounter: Payer: Self-pay | Admitting: Physical Therapy

## 2021-11-11 ENCOUNTER — Other Ambulatory Visit: Payer: Self-pay

## 2021-11-11 DIAGNOSIS — M6281 Muscle weakness (generalized): Secondary | ICD-10-CM | POA: Diagnosis not present

## 2021-11-11 DIAGNOSIS — R262 Difficulty in walking, not elsewhere classified: Secondary | ICD-10-CM | POA: Diagnosis not present

## 2021-11-11 DIAGNOSIS — M25572 Pain in left ankle and joints of left foot: Secondary | ICD-10-CM | POA: Diagnosis not present

## 2021-11-11 NOTE — Therapy (Signed)
OUTPATIENT PHYSICAL THERAPY TREATMENT NOTE   Patient Name: Christie Davis MRN: [id number] DOB:Dec 18, 1945, 76 y.o., female Today's Date: 11/11/2021  PCP: Harlan Stains, MD REFERRING PROVIDER: Meredith Pel, MD   PT End of Session - 11/11/21 1358     Visit Number 2    Number of Visits 20    Date for PT Re-Evaluation 01/15/22    Authorization Type [employer] Medicare $35 copay    Progress Note Due on Visit 10    PT Start Time 1352    PT Stop Time 1438    PT Time Calculation (min) 46 min    Activity Tolerance Patient tolerated treatment well    Behavior During Therapy [facility] for tasks assessed/performed             Past Medical History:  Diagnosis Date   Chronic cough    off and on-   Hypertension    Medical history non-contributory    Wears glasses    Past Surgical History:  Procedure Laterality Date   COLONOSCOPY     ORIF HUMERUS FRACTURE Right 12/06/2012   Procedure: OPEN REDUCTION INTERNAL FIXATION (ORIF) PROXIMAL HUMERUS FRACTURE;  Surgeon: Nita Sells, MD;  Location: [place];  Service: Orthopedics;  Laterality: Right;   RECTAL TUMOR BY PROCTOTOMY EXCISION  2007   TUBAL LIGATION     There are no problems to display for this patient.   REFERRING DIAG: Y60.630Z (ICD-10-CM) - Closed fracture of left ankle, initial encounter  ONSET DATE: 10/22/2021  THERAPY DIAG:  Pain in left ankle and joints of left foot  Muscle weakness (generalized)  Difficulty in walking, not elsewhere classified  PERTINENT HISTORY: HTN.  Injury 10/22/2021 with ED visit on 10/23/2021  PRECAUTIONS: in cast  SUBJECTIVE: She has been doing her exercises 2-3 times per day. She got a RW on Thurs & tried once but RLE too weak.   PAIN:   Denies left ankle pain Are you having pain? Yes NPRS scale: 6/10 Pain location: right knee Pain orientation: Right and Anterior  PAIN TYPE: throbbing Pain description: intermittent  Aggravating factors: massaging it,  trying to hop Relieving factors: sitting   TODAY'S TREATMENT: 11/11/21 Therapeutic Exercise: Aerobic: Supine: Prone: Seated:  Pt demo HEP of chair push-up & SLR BLEs.   Standing: Neuromuscular Re-education: Manual Therapy: Therapeutic Activity: Gait: Pt acquired a RW with fixed 5" front wheels from her church.  PT checked ht & changed out tennis balls on back so RW moves easier. All activities NWB LLE.   PT demo & verbal cues on technique including shoulder over hands, her position within RW & timing BUE & RLE movement for hop.  Stand pivot transfer with RW <> 20" mat table with supervision / contact assist.  PT demo sit/stand chairs without armrests - Pt hopped 5' X 2 and 8' X 2 with contact assist.  Sit to /from stand from 18" chairs with armrests with verbal cues only and chairs without armrests using [place] with [place].  PT demo & verbal cues how to move chair 90* and slide under/back from table.  Pt return demo understanding.   Self Care: Trigger Point Dry Needling:  Modalities:    PATIENT EDUCATION:  11/11/2021:  Education details: reviewed HEP, safety with knee scooter including sit/stand, and husband assisting hopping with RW.  Person educated: Patient & spouse Education method: Explanation, Demonstration, Verbal cues Education comprehension: verbalized understanding and returned demonstration  11/06/2021:     Therex:  HEP instruction/performance c cues for techniques, handout provided.  Trial set performed of each for comprehension and symptom assessment.  See below for exercise list.   Gait training:        CGA to min A occasionally during training.  Education with verbal, visual and tactile cues for sequencing and positioning for ambulation c FWW.  Performed 15 ft, 10 ft.  Attempted axillary crutch use bilaterally c CGA to min A 5 ft with poor control noted and instability.  Cues given throughout to avoid WB on Lt leg.  Additional time spent in review of techniques  during performance and in pre and post performance.  More education and trial required.     TherActivity:        Cues verbally, visually given for sit to stand to sit transfers from various table heights (between 17 and 20 inch heights).  Focus on use of UE on sitting surface to generate movement vs use of UE on assistive devices.  Trials given totaling 10-12x with improved performance noted during course of trial.   OBJECTIVE:        PATIENT SURVEYS:  11/06/2021: FOTO Ankle intake: 8   predicted:  50   COGNITION:          11/06/2021: Overall cognitive status: Within functional limits for tasks assessed                    PALPATION: 11/06/2021: unable to palpate (cast on)   LE AROM/PROM:   A/PROM Right 11/06/2021 Left 11/06/2021  Hip flexion      Hip extension      Hip abduction      Hip adduction      Hip internal rotation      Hip external rotation      Knee flexion      Knee extension      Ankle dorsiflexion      Ankle plantarflexion      Ankle inversion      Ankle eversion       (Blank rows = not tested)   LE MMT:   MMT Right 11/06/2021 Left 11/06/2021  Hip flexion 5/5 4/5  Hip extension      Hip abduction      Hip adduction      Hip internal rotation      Hip external rotation      Knee flexion 5/5 4/5  Knee extension 5/5 4/5  Ankle dorsiflexion 5/5    Ankle plantarflexion      Ankle inversion 5/5    Ankle eversion 5/5     (Blank rows = not tested)   UE MMT: MMT Right 11/06/2021 Left 11/06/2021  shoulder flexion 4+/5 5/5  shoulder extension      shoulder abduction 4/5 5/5  shoulder adduction      shoulder internal rotation 5/5 5/5  shoulder external rotation 4/5 5/5  elbow flexion 5/5 5/5  elbow extension 5/5 5/5                                  LOWER EXTREMITY SPECIAL TESTS:  11/06/2021: none performed today   FUNCTIONAL TESTS:  11/06/2021: non performed today   GAIT: 11/06/2021:  Distance walked: 15 ft Assistive device utilized: FWW Level  of assistance: CGA to Min A Comments: step to gait pattern c NWB on Lt leg     PATIENT EDUCATION:  11/06/2021:  Education details:  HEP, POC Person educated: Patient Education method: Explanation, Demonstration, Verbal cues, and Handouts Education comprehension: verbalized understanding and returned demonstration     HOME EXERCISE PROGRAM: 11/06/2021: Access Code: [email or web address] URL: [email or web address] Date: 11/06/2021 Prepared by: Scot Jun   Exercises Seated Chair Push Ups - 1-2 x daily - 7 x weekly - 3 sets - 10 reps Seated Straight Leg Heel Taps - 1-2 x daily - 7 x weekly - 3 sets - 10 reps     ASSESSMENT:   CLINICAL IMPRESSION: Patient improved her ability to transfer & hop with RW but is limited in distance by right knee pain.  She sees Dr. Marlou Sa on 11/13/21. If her WBing status is advanced to even Toe Touch or PWB, then it would probably improve her mobility, distance & balance.     REHAB POTENTIAL: Good   CLINICAL DECISION MAKING: Stable/uncomplicated   EVALUATION COMPLEXITY: Low     GOALS: Goals reviewed with patient? Yes   SHORT TERM GOALS:   STG Name Target Date Goal status  1 Patient will demonstrate independent use of home exercise program to maintain progress from in clinic treatments.  11/27/2021 INITIAL  2 Patient will demonstrate modified independent ambulation c LRAD community distances (pending WB changes) 11/27/2021 Initial  3  Patient will demonstrate safe ascending/descending stairs c use of assistive device and rails to facilitate household navigation.  11/27/2021 Initial                                        LONG TERM GOALS:    LTG Name Target Date Goal status  1 Patient will demonstrate/report pain at worst less than or equal to 2/10 to facilitate minimal limitation in daily activity secondary to pain symptoms.  01/15/2022 INITIAL  2 Patient will demonstrate independent use of home exercise program to facilitate ability to  maintain/progress functional gains from skilled physical therapy services.  01/15/2022 INITIAL  3 Patient will demonstrate FOTO outcome > or = 50% to indicated reduced disability due to condition. 01/15/2022 INITIAL  4 Patient will demonstrate Lt knee MMT 5/5, ankle 5/5 throughout to facilitate ability to perform usual standing, walking, stairs at PLOF s limitation due to symptoms. 01/15/2022 INITIAL  5 Patient will demonstrate independent ambulation community distances > 300 ft to return to normal (pending WB restrictions) 01/15/2022 INITIAL  6 Patient will demonstrate reciprocal gait pattern up/down stairs for household navigation.  01/15/2022 INITIAL  7 Plan to add goal for Lt ankle mobility once cast removed.         PLAN: PT FREQUENCY: 2x/week   PT DURATION: 10 weeks   PLANNED INTERVENTIONS: Therapeutic exercises, Therapeutic activity, Neuro Muscular re-education, Balance training, Gait training, Patient/Family education, Joint mobilization, Stair training, DME instructions, Dry Needling, Electrical stimulation, Cryotherapy, Moist heat, Taping, Ultrasound, Ionotophoresis 4mg /ml Dexamethasone, and Manual therapy.  All included unless contraindicated   PLAN FOR NEXT SESSION:  check MD note and adjust treatment as indicated,  progress gait with RW,  therapeutic exercise for strength BUEs & RLE,       Jamey Reas, PT, DPT 11/11/2021, 2:58 PM

## 2021-11-13 ENCOUNTER — Ambulatory Visit: Payer: Medicare HMO | Admitting: Surgical

## 2021-11-13 ENCOUNTER — Other Ambulatory Visit: Payer: Self-pay

## 2021-11-13 ENCOUNTER — Ambulatory Visit (INDEPENDENT_AMBULATORY_CARE_PROVIDER_SITE_OTHER): Payer: No Typology Code available for payment source

## 2021-11-13 DIAGNOSIS — S82892A Other fracture of left lower leg, initial encounter for closed fracture: Secondary | ICD-10-CM | POA: Diagnosis not present

## 2021-11-17 ENCOUNTER — Encounter: Payer: Self-pay | Admitting: Orthopedic Surgery

## 2021-11-17 NOTE — Progress Notes (Signed)
° °  Post-fracture Visit Note   Patient: Christie Davis           Date of Birth: May 28, 1946           MRN: [id number] Visit Date: 11/13/2021 PCP: Harlan Stains, MD   Assessment & Plan:  Chief Complaint:  Chief Complaint  Patient presents with   Left Ankle - Fracture, Follow-up    F/u left ankle fracture.  Date of injury 10/22/2021.    Visit Diagnoses:  1. Closed fracture of left ankle, initial encounter     Plan: Patient is a 76 year old female who presents for follow-up of left ankle fracture.  Date of injury 10/22/2021.  She reports she is doing well overall and pain is controlled.  Only has occasional aching pain for which she takes Tylenol.  She is taking aspirin every day for DVT prophylaxis.  She denies any chest pain or shortness of breath or any calf pain.  Cast was discontinued today.  On exam, she has palpable pedal pulses with intact ankle dorsiflexion, plantarflexion, inversion, eversion.  She has sensation throughout the entirety of the left foot and ankle.  No tenderness over the medial malleolus.  There is moderate tenderness that patient rates 6/10 over the site of the lateral malleolus fracture.  No calf tenderness.  Negative Homans' sign.  Plan is to transition from the cast to fracture boot.  Left ankle radiographs taken today do demonstrate no significant callus formation that is identifiable at this time.  With lack of callus formation and the moderate tenderness she continues to have on exam today, plan to hold off on weightbearing for now.  Follow-up in 2 weeks for clinical recheck with new radiographs at the time and likely initiation of partial weightbearing based on how her radiographs look.  Continue with knee scooter ambulation for now.  Follow-Up Instructions: No follow-ups on file.   Orders:  Orders Placed This Encounter  Procedures   XR Ankle Complete Left   No orders of the defined types were placed in this encounter.   Imaging: No results  found.  PMFS History: There are no problems to display for this patient.  Past Medical History:  Diagnosis Date   Chronic cough    off and on-   Hypertension    Medical history non-contributory    Wears glasses     No family history on file.  Past Surgical History:  Procedure Laterality Date   COLONOSCOPY     ORIF HUMERUS FRACTURE Right 12/06/2012   Procedure: OPEN REDUCTION INTERNAL FIXATION (ORIF) PROXIMAL HUMERUS FRACTURE;  Surgeon: Nita Sells, MD;  Location: [place];  Service: Orthopedics;  Laterality: Right;   RECTAL TUMOR BY PROCTOTOMY EXCISION  2007   TUBAL LIGATION     Social History   Occupational History   Not on file  Tobacco Use   Smoking status: Never   Smokeless tobacco: Never  Substance and Sexual Activity   Alcohol use: No   Drug use: No   Sexual activity: Not on file

## 2021-11-18 ENCOUNTER — Telehealth: Payer: Self-pay | Admitting: Physical Therapy

## 2021-11-18 NOTE — Telephone Encounter (Signed)
Lurena Joiner, I read your 11/13/21 note for Ms. Christie Davis that she is still NWB but transitioned from cast to boot.  In PT can we start any ROM passive or active? Thank you Shirlean Mylar

## 2021-11-18 NOTE — Therapy (Signed)
OUTPATIENT PHYSICAL THERAPY TREATMENT NOTE   Patient Name: Christie Davis MRN: [id number] DOB:March 26, 1946, 76 y.o., female Today's Date: 11/19/2021  PCP: Harlan Stains, MD REFERRING PROVIDER: Meredith Pel, MD   PT End of Session - 11/19/21 1438     Visit Number 3    Number of Visits 20    Date for PT Re-Evaluation 01/15/22    Authorization Type [employer] Medicare $35 copay    Progress Note Due on Visit 10    PT Start Time 1438    PT Stop Time 1507    PT Time Calculation (min) 29 min    Activity Tolerance Patient tolerated treatment well    Behavior During Therapy WFL for tasks assessed/performed              Past Medical History:  Diagnosis Date   Chronic cough    off and on-   Hypertension    Medical history non-contributory    Wears glasses    Past Surgical History:  Procedure Laterality Date   COLONOSCOPY     ORIF HUMERUS FRACTURE Right 12/06/2012   Procedure: OPEN REDUCTION INTERNAL FIXATION (ORIF) PROXIMAL HUMERUS FRACTURE;  Surgeon: Nita Sells, MD;  Location: [place];  Service: Orthopedics;  Laterality: Right;   RECTAL TUMOR BY PROCTOTOMY EXCISION  2007   TUBAL LIGATION     There are no problems to display for this patient.   REFERRING DIAG: T46.568L (ICD-10-CM) - Closed fracture of left ankle, initial encounter  ONSET DATE: 10/22/2021  THERAPY DIAG:  Pain in left ankle and joints of left foot  Muscle weakness (generalized)  Difficulty in walking, not elsewhere classified  PERTINENT HISTORY: HTN.  Injury 10/22/2021 with ED visit on 10/23/2021  PRECAUTIONS: 11/19/21 NWB LLE, okay for PROM & AROM  SUBJECTIVE: PA note on 11/13/21 "Plan is to transition from the cast to fracture boot.  Left ankle radiographs taken today do demonstrate no significant callus formation that is identifiable at this time.  With lack of callus formation and the moderate tenderness she continues to have on exam today, plan to hold off on  weightbearing for now." Continue knee scooter. PT messaged PA and he okayed PROM & AROM but NWB LLE.   PAIN:   Denies left ankle pain today but some soreness first night out of cast.  Are you having pain? Yes NPRS scale: 3/10 in last 3-4 days lowest 1/10 & highest 3-4/10 Pain location: right knee Pain orientation: Right and Anterior  PAIN TYPE: throbbing Pain description: intermittent  Aggravating factors: massaging it, trying to hop Relieving factors: sitting   TODAY'S TREATMENT: 11/19/2021 PT updated HEP for AROM ankle noted below.  Pt performed left ankle AROM 10 reps 2 sets each dorsiflexion/plantarflexion, inversion/eversion, circles CW & CCW and alphabet.  Seated with LLE extended on her knee scooter.    11/19/21 1513  PT Education  Education Details updated HEP  Person(s) Educated Patient  Methods Explanation;Demonstration;Tactile cues;Verbal cues;Handout  Comprehension Verbalized understanding;Returned demonstration;Verbal cues required;Tactile cues required;Need further instruction    Access Code: [email or web address] URL: [email or web address] Date: 11/19/2021 Prepared by: Jamey Reas  Exercises Seated Chair Push Ups - 1-2 x daily - 7 x weekly - 3 sets - 10 reps Seated Straight Leg Heel Taps - 1-2 x daily - 7 x weekly - 3 sets - 10 reps Long Sitting Ankle Pumps - 2-3 x daily - 7 x weekly - 2 sets - 10 reps - 5 seconds hold Supine Ankle Inversion Eversion AROM -  2-3 x daily - 7 x weekly - 2 sets - 10 reps - 5 seconds hold Supine Ankle Circles - 2-3 x daily - 7 x weekly - 2 sets - 10 reps - 5 seconds hold Ankle Alphabet in Elevation - 1 x daily - 5 x weekly - 1 sets - 10 reps - 5 seconds hold  Self-Care:  PT instructed with demo in using pillows to "tent" sheets off feet and between LEs in sidelying.  Pt verbalized understanding.  PT also instructed in use of towel roll on lateral thigh to decrease external rotation of LE. Pt verbalized understanding. Make sure to  dry between toes after bathing. Needs to make sure drinking water to stay hydrated.   11/11/21 Treatment Therapeutic Exercise: Seated:  Pt demo HEP of chair push-up & SLR BLEs.    Neuromuscular Re-education: Manual Therapy: Therapeutic Activity: Gait: Pt acquired a RW with fixed 5" front wheels from her church.  PT checked ht & changed out tennis balls on back so RW moves easier. All activities NWB LLE.   PT demo & verbal cues on technique including shoulder over hands, her position within RW & timing BUE & RLE movement for hop.  Stand pivot transfer with RW <> 20" mat table with supervision / contact assist.  PT demo sit/stand chairs without armrests - Pt hopped 5' X 2 and 8' X 2 with contact assist.  Sit to /from stand from 18" chairs with armrests with verbal cues only and chairs without armrests using [place] with [place].  PT demo & verbal cues how to move chair 90* and slide under/back from table.  Pt return demo understanding.   Self Care: Trigger Point Dry Needling:  Modalities:    PATIENT EDUCATION:  11/11/2021:  Education details: reviewed HEP, safety with knee scooter including sit/stand, and husband assisting hopping with RW.  Person educated: Patient & spouse Education method: Explanation, Demonstration, Verbal cues Education comprehension: verbalized understanding and returned demonstration  11/06/2021:     Therex:              HEP instruction/performance c cues for techniques, handout provided.  Trial set performed of each for comprehension and symptom assessment.  See below for exercise list.   Gait training:        CGA to min A occasionally during training.  Education with verbal, visual and tactile cues for sequencing and positioning for ambulation c FWW.  Performed 15 ft, 10 ft.  Attempted axillary crutch use bilaterally c CGA to min A 5 ft with poor control noted and instability.  Cues given throughout to avoid WB on Lt leg.  Additional time spent in review of techniques during  performance and in pre and post performance.  More education and trial required.     TherActivity:        Cues verbally, visually given for sit to stand to sit transfers from various table heights (between 17 and 20 inch heights).  Focus on use of UE on sitting surface to generate movement vs use of UE on assistive devices.  Trials given totaling 10-12x with improved performance noted during course of trial.    OBJECTIVE:    PATIENT SURVEYS:  11/06/2021: FOTO Ankle intake: 8   predicted:  50   COGNITION:          11/06/2021: Overall cognitive status: Within functional limits for tasks assessed  PALPATION: 11/06/2021: unable to palpate (cast on)   LE AROM/PROM:   A/PROM Right 11/06/2021 Left 11/06/2021  Hip flexion      Hip extension      Hip abduction      Hip adduction      Hip internal rotation      Hip external rotation      Knee flexion      Knee extension      Ankle dorsiflexion    A: 4*  Ankle plantarflexion   A: 29*   Ankle inversion   A: 11*   Ankle eversion   A: 1*    (Blank rows = not tested)   LE MMT:   MMT Right 11/06/2021 Left 11/06/2021  Hip flexion 5/5 4/5  Hip extension      Hip abduction      Hip adduction      Hip internal rotation      Hip external rotation      Knee flexion 5/5 4/5  Knee extension 5/5 4/5  Ankle dorsiflexion 5/5    Ankle plantarflexion      Ankle inversion 5/5    Ankle eversion 5/5     (Blank rows = not tested)   UE MMT: MMT Right 11/06/2021 Left 11/06/2021  shoulder flexion 4+/5 5/5  shoulder extension      shoulder abduction 4/5 5/5  shoulder adduction      shoulder internal rotation 5/5 5/5  shoulder external rotation 4/5 5/5  elbow flexion 5/5 5/5  elbow extension 5/5 5/5                                  LOWER EXTREMITY SPECIAL TESTS:  11/06/2021: none performed today   FUNCTIONAL TESTS:  11/06/2021: non performed today   GAIT: 11/06/2021:  Distance walked: 15 ft Assistive device utilized:  FWW Level of assistance: CGA to Min A Comments: step to gait pattern c NWB on Lt leg     PATIENT EDUCATION:  11/06/2021:  Education details: HEP, POC Person educated: Patient Education method: Consulting civil engineer, Media planner, Verbal cues, and Handouts Education comprehension: verbalized understanding and returned demonstration     HOME EXERCISE PROGRAM:Access Code: [email or web address] URL: [email or web address] Date: 11/19/2021 Prepared by: Jamey Reas  Exercises Seated Chair Push Ups - 1-2 x daily - 7 x weekly - 3 sets - 10 reps Seated Straight Leg Heel Taps - 1-2 x daily - 7 x weekly - 3 sets - 10 reps Long Sitting Ankle Pumps - 2-3 x daily - 7 x weekly - 2 sets - 10 reps - 5 seconds hold Supine Ankle Inversion Eversion AROM - 2-3 x daily - 7 x weekly - 2 sets - 10 reps - 5 seconds hold Supine Ankle Circles - 2-3 x daily - 7 x weekly - 2 sets - 10 reps - 5 seconds hold Ankle Circles in Elevation - 2-3 x daily - 7 x weekly - 2 sets - 10 reps - 5 seconds hold  11/06/2021: Access Code: [email or web address] URL: [email or web address] Date: 11/06/2021 Prepared by: Scot Jun   Exercises Seated Chair Push Ups - 1-2 x daily - 7 x weekly - 3 sets - 10 reps Seated Straight Leg Heel Taps - 1-2 x daily - 7 x weekly - 3 sets - 10 reps     ASSESSMENT:   CLINICAL IMPRESSION: Patient appears to understand updated HEP of ankle AROM.  PT addressed issues with her comfort  for positioning to sleep and she appears to understand.     REHAB POTENTIAL: Good   CLINICAL DECISION MAKING: Stable/uncomplicated   EVALUATION COMPLEXITY: Low     GOALS: Goals reviewed with patient? Yes   SHORT TERM GOALS:   STG Name Target Date Goal status  1 Patient will demonstrate independent use of home exercise program to maintain progress from in clinic treatments.  11/27/2021 INITIAL  2 Patient will demonstrate modified independent ambulation c LRAD community distances (pending WB changes) 11/27/2021  Initial  3  Patient will demonstrate safe ascending/descending stairs c use of assistive device and rails to facilitate household navigation.  11/27/2021 Initial                                        LONG TERM GOALS:    LTG Name Target Date Goal status  1 Patient will demonstrate/report pain at worst less than or equal to 2/10 to facilitate minimal limitation in daily activity secondary to pain symptoms.  01/15/2022 INITIAL  2 Patient will demonstrate independent use of home exercise program to facilitate ability to maintain/progress functional gains from skilled physical therapy services.  01/15/2022 INITIAL  3 Patient will demonstrate FOTO outcome > or = 50% to indicated reduced disability due to condition. 01/15/2022 INITIAL  4 Patient will demonstrate Lt knee MMT 5/5, ankle 5/5 throughout to facilitate ability to perform usual standing, walking, stairs at PLOF s limitation due to symptoms. 01/15/2022 INITIAL  5 Patient will demonstrate independent ambulation community distances > 300 ft to return to normal (pending WB restrictions) 01/15/2022 INITIAL  6 Patient will demonstrate reciprocal gait pattern up/down stairs for household navigation.  01/15/2022 INITIAL  7 Plan to add goal for Lt ankle mobility once cast removed.         PLAN: PT FREQUENCY: 2x/week   PT DURATION: 10 weeks   PLANNED INTERVENTIONS: Therapeutic exercises, Therapeutic activity, Neuro Muscular re-education, Balance training, Gait training, Patient/Family education, Joint mobilization, Stair training, DME instructions, Dry Needling, Electrical stimulation, Cryotherapy, Moist heat, Taping, Ultrasound, Ionotophoresis 4mg /ml Dexamethasone, and Manual therapy.  All included unless contraindicated   PLAN FOR NEXT SESSION:  check updated HEP,  PROM & AROM for left ankle.  NWB LLE currently but can remove boot to exercise.   Jamey Reas, PT, DPT 11/19/2021, 3:21 PM

## 2021-11-18 NOTE — Telephone Encounter (Signed)
Yes, okay for active and passive ROM.  Thanks for asking

## 2021-11-19 ENCOUNTER — Encounter: Payer: Self-pay | Admitting: Physical Therapy

## 2021-11-19 ENCOUNTER — Other Ambulatory Visit: Payer: Self-pay

## 2021-11-19 ENCOUNTER — Ambulatory Visit: Payer: Medicare HMO | Admitting: Physical Therapy

## 2021-11-19 DIAGNOSIS — M25572 Pain in left ankle and joints of left foot: Secondary | ICD-10-CM

## 2021-11-19 DIAGNOSIS — R262 Difficulty in walking, not elsewhere classified: Secondary | ICD-10-CM

## 2021-11-19 DIAGNOSIS — M6281 Muscle weakness (generalized): Secondary | ICD-10-CM | POA: Diagnosis not present

## 2021-11-19 NOTE — Progress Notes (Signed)
°   11/19/21 1513  PT Education  Education Details updated HEP  Person(s) Educated Patient  Methods Explanation;Demonstration;Tactile cues;Verbal cues;Handout  Comprehension Verbalized understanding;Returned demonstration;Verbal cues required;Tactile cues required;Need further instruction

## 2021-11-19 NOTE — Telephone Encounter (Signed)
Thank you :)

## 2021-11-20 ENCOUNTER — Ambulatory Visit: Payer: Medicare HMO | Admitting: Physical Therapy

## 2021-11-20 ENCOUNTER — Encounter: Payer: Self-pay | Admitting: Physical Therapy

## 2021-11-20 DIAGNOSIS — R262 Difficulty in walking, not elsewhere classified: Secondary | ICD-10-CM | POA: Diagnosis not present

## 2021-11-20 DIAGNOSIS — M6281 Muscle weakness (generalized): Secondary | ICD-10-CM | POA: Diagnosis not present

## 2021-11-20 DIAGNOSIS — M25572 Pain in left ankle and joints of left foot: Secondary | ICD-10-CM | POA: Diagnosis not present

## 2021-11-20 NOTE — Therapy (Signed)
OUTPATIENT PHYSICAL THERAPY TREATMENT NOTE   Patient Name: Christie Davis MRN: [id number] DOB:[birth year], 76 y.o., female Today's Date: 11/20/2021  PCP: Harlan Stains, MD REFERRING PROVIDER: Meredith Pel, MD   PT End of Session - 11/20/21 1434     Visit Number 4    Number of Visits 20    Date for PT Re-Evaluation 01/15/22    Authorization Type [employer] $35 copay    Progress Note Due on Visit 10    PT Start Time 1430    PT Stop Time 1508    PT Time Calculation (min) 38 min    Activity Tolerance Patient tolerated treatment well    Behavior During Therapy WFL for tasks assessed/performed               Past Medical History:  Diagnosis Date   Chronic cough    off and on-   Hypertension    Medical history non-contributory    Wears glasses    Past Surgical History:  Procedure Laterality Date   COLONOSCOPY     ORIF HUMERUS FRACTURE Right 12/06/2012   Procedure: OPEN REDUCTION INTERNAL FIXATION (ORIF) PROXIMAL HUMERUS FRACTURE;  Surgeon: Nita Sells, MD;  Location: [facility];  Service: Orthopedics;  Laterality: Right;   RECTAL TUMOR BY PROCTOTOMY EXCISION  2007   TUBAL LIGATION     There are no problems to display for this patient.   REFERRING DIAG: X91.478G (ICD-10-CM) - Closed fracture of left ankle, initial encounter  ONSET DATE: 10/22/2021  THERAPY DIAG:  Pain in left ankle and joints of left foot  Muscle weakness (generalized)  Difficulty in walking, not elsewhere classified  PERTINENT HISTORY: HTN.  Injury 10/22/2021 with ED visit on 10/23/2021  PRECAUTIONS: 11/19/21 NWB LLE, okay for PROM & AROM  SUBJECTIVE: No increased pain in ankle from exercises. She was able to put boot on leg herself.   PAIN:  Denies left ankle pain today  Are you having pain? Yes NPRS scale:  0/10  Pain location: right knee Pain orientation: Right and Anterior  PAIN TYPE: throbbing Pain description: intermittent  Aggravating  factors: massaging it, trying to hop Relieving factors: sitting  OBJECTIVE:  X-ray 10/23/21 IMPRESSION: 1. Acute, obliquely oriented, intra-articular fracture deformity involving the distal fibula. 2. Plantar heel spur.  PATIENT SURVEYS:  11/06/2021: FOTO Ankle intake: 8   predicted:  50   COGNITION: 11/06/2021: Overall cognitive status: Within functional limits for tasks assessed                    PALPATION: 11/06/2021: unable to palpate (cast on)   LE AROM/PROM:   A/PROM Right 11/06/21 Left 11/06/21  Hip flexion      Hip extension      Hip abduction      Hip adduction      Hip internal rotation      Hip external rotation      Knee flexion      Knee extension      Ankle dorsiflexion    A: 4*  Ankle plantarflexion   A: 29*   Ankle inversion   A: 11*   Ankle eversion   A: 1*    (Blank rows = not tested)   LE MMT:   MMT Right 11/06/21 Left 11/06/21  Hip flexion 5/5 4/5  Hip extension      Hip abduction      Hip adduction      Hip internal rotation  Hip external rotation      Knee flexion 5/5 4/5  Knee extension 5/5 4/5  Ankle dorsiflexion 5/5    Ankle plantarflexion      Ankle inversion 5/5    Ankle eversion 5/5     (Blank rows = not tested)   UE MMT: MMT Right 11/06/21 Left 11/06/21  shoulder flexion 4+/5 5/5  shoulder extension      shoulder abduction 4/5 5/5  shoulder adduction      shoulder internal rotation 5/5 5/5  shoulder external rotation 4/5 5/5  elbow flexion 5/5 5/5  elbow extension 5/5 5/5                                  LOWER EXTREMITY SPECIAL TESTS:  11/06/2021: none performed today   FUNCTIONAL TESTS:  11/06/2021: non performed today   GAIT: 11/06/2021: Distance walked: 15 ft Assistive device utilized: FWW Level of assistance: CGA to Min A Comments: step to gait pattern c NWB on Lt leg  TODAY'S TREATMENT: 11/20/2021 left ankle AROM 10 reps 2 sets each dorsiflexion/plantarflexion, inversion/eversion, circles CW & CCW and  alphabet.  Seated with LLE extended on her knee scooter.   Seated marbles 10 pick up move to target cup anterior for DF, medial for inversion & lateral for eversion.   PROM to phalanges with soft tissue mobilizations to flexor tendons with stretch.  PT worked on desensitizing lateral ankle with multimodal sensation 1 min ea. Hot pack, ice pack, light touch pillow case, rubbing with towel 2 reps each.   11/19/2021 PT updated HEP for AROM ankle noted below.  Pt performed left ankle AROM 10 reps 2 sets each dorsiflexion/plantarflexion, inversion/eversion, circles CW & CCW and alphabet.  Seated with LLE extended on her knee scooter.    11/19/21 1513  PT Education  Education Details updated HEP  Person(s) Educated Patient  Methods Explanation;Demonstration;Tactile cues;Verbal cues;Handout  Comprehension Verbalized understanding;Returned demonstration;Verbal cues required;Tactile cues required;Need further instruction    Access Code: [email or web address] URL: [email or web address] Date: 11/19/2021 Prepared by: Jamey Reas  Exercises Seated Chair Push Ups - 1-2 x daily - 7 x weekly - 3 sets - 10 reps Seated Straight Leg Heel Taps - 1-2 x daily - 7 x weekly - 3 sets - 10 reps Long Sitting Ankle Pumps - 2-3 x daily - 7 x weekly - 2 sets - 10 reps - 5 seconds hold Supine Ankle Inversion Eversion AROM - 2-3 x daily - 7 x weekly - 2 sets - 10 reps - 5 seconds hold Supine Ankle Circles - 2-3 x daily - 7 x weekly - 2 sets - 10 reps - 5 seconds hold Ankle Alphabet in Elevation - 1 x daily - 5 x weekly - 1 sets - 10 reps - 5 seconds hold  Self-Care:  PT instructed with demo in using pillows to "tent" sheets off feet and between LEs in sidelying.  Pt verbalized understanding.  PT also instructed in use of towel roll on lateral thigh to decrease external rotation of LE. Pt verbalized understanding. Make sure to dry between toes after bathing. Needs to make sure drinking water to stay hydrated.    11/11/21 Treatment Therapeutic Exercise: Seated:  Pt demo HEP of chair push-up & SLR BLEs.    Neuromuscular Re-education: Manual Therapy: Therapeutic Activity: Gait: Pt acquired a RW with fixed 5" front wheels from her church.  PT checked ht & changed out tennis balls  on back so RW moves easier. All activities NWB LLE.   PT demo & verbal cues on technique including shoulder over hands, her position within RW & timing BUE & RLE movement for hop.  Stand pivot transfer with RW <> 20" mat table with supervision / contact assist.  PT demo sit/stand chairs without armrests - Pt hopped 5' X 2 and 8' X 2 with contact assist.  Sit to /from stand from 18" chairs with armrests with verbal cues only and chairs without armrests using [place] with [place].  PT demo & verbal cues how to move chair 90* and slide under/back from table.  Pt return demo understanding.   Self Care: Trigger Point Dry Needling:  Modalities:    PATIENT EDUCATION:  11/11/2021:  Education details: reviewed HEP, safety with knee scooter including sit/stand, and husband assisting hopping with RW.  Person educated: Patient & spouse Education method: Explanation, Demonstration, Verbal cues Education comprehension: verbalized understanding and returned demonstration  PATIENT EDUCATION:  11/06/2021:  Education details: HEP, POC Person educated: Patient Education method: Consulting civil engineer, Demonstration, Verbal cues, and Handouts Education comprehension: verbalized understanding and returned demonstration     HOME EXERCISE PROGRAM:Access Code: [email or web address] URL: [email or web address] Date: 11/19/2021 Prepared by: Jamey Reas  Exercises Seated Chair Push Ups - 1-2 x daily - 7 x weekly - 3 sets - 10 reps Seated Straight Leg Heel Taps - 1-2 x daily - 7 x weekly - 3 sets - 10 reps Long Sitting Ankle Pumps - 2-3 x daily - 7 x weekly - 2 sets - 10 reps - 5 seconds hold Supine Ankle Inversion Eversion AROM - 2-3 x daily - 7 x weekly -  2 sets - 10 reps - 5 seconds hold Supine Ankle Circles - 2-3 x daily - 7 x weekly - 2 sets - 10 reps - 5 seconds hold Ankle Circles in Elevation - 2-3 x daily - 7 x weekly - 2 sets - 10 reps - 5 seconds hold  11/06/2021: Access Code: [email or web address] URL: [email or web address] Date: 11/06/2021 Prepared by: Scot Jun   Exercises Seated Chair Push Ups - 1-2 x daily - 7 x weekly - 3 sets - 10 reps Seated Straight Leg Heel Taps - 1-2 x daily - 7 x weekly - 3 sets - 10 reps     ASSESSMENT:   CLINICAL IMPRESSION: Patient reported less lateral tenderness of ankle after multimodal therapy.  Patient had improved AROM of ankle. Pt continues to benefit from skilled PT.     REHAB POTENTIAL: Good   CLINICAL DECISION MAKING: Stable/uncomplicated   EVALUATION COMPLEXITY: Low   GOALS: Goals reviewed with patient? Yes   SHORT TERM GOALS:   STG Name Target Date Goal status  1 Patient will demonstrate independent use of home exercise program to maintain progress from in clinic treatments.  11/27/2021 INITIAL  2 Patient will demonstrate modified independent ambulation c LRAD community distances (pending WB changes) 11/27/2021 Initial  3  Patient will demonstrate safe ascending/descending stairs c use of assistive device and rails to facilitate household navigation.  11/27/2021 Initial                                        LONG TERM GOALS:    LTG Name Target Date Goal status  1 Patient will demonstrate/report pain at worst less than or equal to 2/10 to facilitate minimal limitation in daily activity secondary to pain  symptoms.  01/15/2022 INITIAL  2 Patient will demonstrate independent use of home exercise program to facilitate ability to maintain/progress functional gains from skilled physical therapy services.  01/15/2022 INITIAL  3 Patient will demonstrate FOTO outcome > or = 50% to indicated reduced disability due to condition. 01/15/2022 INITIAL  4 Patient will demonstrate Lt knee MMT  5/5, ankle 5/5 throughout to facilitate ability to perform usual standing, walking, stairs at PLOF s limitation due to symptoms. 01/15/2022 INITIAL  5 Patient will demonstrate independent ambulation community distances > 300 ft to return to normal (pending WB restrictions) 01/15/2022 INITIAL  6 Patient will demonstrate reciprocal gait pattern up/down stairs for household navigation.  01/15/2022 INITIAL  7 Plan to add goal for Lt ankle mobility once cast removed.         PLAN: PT FREQUENCY: 2x/week   PT DURATION: 10 weeks   PLANNED INTERVENTIONS: Therapeutic exercises, Therapeutic activity, Neuro Muscular re-education, Balance training, Gait training, Patient/Family education, Joint mobilization, Stair training, DME instructions, Dry Needling, Electrical stimulation, Cryotherapy, Moist heat, Taping, Ultrasound, Ionotophoresis 4mg /ml Dexamethasone, and Manual therapy.  All included unless contraindicated   PLAN FOR NEXT SESSION: Continue exercises in compliance with NWB status, desensitization, PROM & AROM for left ankle.  NWB LLE currently but can remove boot to exercise. Consider Blood Flow Restriction Therapy.  Jamey Reas, PT, DPT 11/20/2021, 4:50 PM

## 2021-11-25 ENCOUNTER — Other Ambulatory Visit: Payer: Self-pay

## 2021-11-25 ENCOUNTER — Ambulatory Visit: Payer: Medicare HMO | Admitting: Rehabilitative and Restorative Service Providers"

## 2021-11-25 ENCOUNTER — Encounter: Payer: Self-pay | Admitting: Rehabilitative and Restorative Service Providers"

## 2021-11-25 DIAGNOSIS — M25572 Pain in left ankle and joints of left foot: Secondary | ICD-10-CM

## 2021-11-25 DIAGNOSIS — R262 Difficulty in walking, not elsewhere classified: Secondary | ICD-10-CM | POA: Diagnosis not present

## 2021-11-25 DIAGNOSIS — M6281 Muscle weakness (generalized): Secondary | ICD-10-CM

## 2021-11-25 NOTE — Therapy (Signed)
OUTPATIENT PHYSICAL THERAPY TREATMENT NOTE/PROGRESS NOTE   Patient Name: Christie Davis MRN: [id number] DOB:May 27, 1946, 76 y.o., female Today's Date: 11/25/2021  PCP: Harlan Stains, MD REFERRING PROVIDER: Meredith Pel, MD   PT End of Session - 11/25/21 1431     Visit Number 5    Number of Visits 20    Date for PT Re-Evaluation 01/15/22    Authorization Type [employer] Medicare $35 copay    Progress Note Due on Visit 10    PT Start Time 7425    PT Stop Time 1507    PT Time Calculation (min) 39 min    Activity Tolerance Patient tolerated treatment well    Behavior During Therapy [facility] for tasks assessed/performed            Progress Note Reporting Period 11/06/2021 to 11/25/2021  See note below for Objective Data and Assessment of Progress/Goals.        Past Medical History:  Diagnosis Date   Chronic cough    off and on-   Hypertension    Medical history non-contributory    Wears glasses    Past Surgical History:  Procedure Laterality Date   COLONOSCOPY     ORIF HUMERUS FRACTURE Right 12/06/2012   Procedure: OPEN REDUCTION INTERNAL FIXATION (ORIF) PROXIMAL HUMERUS FRACTURE;  Surgeon: Nita Sells, MD;  Location: [place];  Service: Orthopedics;  Laterality: Right;   RECTAL TUMOR BY PROCTOTOMY EXCISION  2007   TUBAL LIGATION     There are no problems to display for this patient.   REFERRING DIAG: Z56.387F (ICD-10-CM) - Closed fracture of left ankle, initial encounter  ONSET DATE: 10/22/2021  THERAPY DIAG:  Pain in left ankle and joints of left foot  Muscle weakness (generalized)  Difficulty in walking, not elsewhere classified  PERTINENT HISTORY: HTN.  Injury 10/22/2021 with ED visit on 10/23/2021  PRECAUTIONS: 11/19/21 NWB LLE, okay for PROM & AROM  SUBJECTIVE: Pt indicated no pain to report at this point, 1/10 at worst.   PAIN:  Denies left ankle pain today  Are you having pain? No NPRS scale:  0/10  Pain location:  right knee Pain orientation: Right and Anterior  PAIN TYPE: throbbing Pain description: rarely Aggravating factors: nothing specific  Relieving factors: n/a  OBJECTIVE:  X-ray 10/23/21 IMPRESSION: 1. Acute, obliquely oriented, intra-articular fracture deformity involving the distal fibula. 2. Plantar heel spur.  PATIENT SURVEYS:  11/06/2021: FOTO Ankle intake: 8   predicted:  50   COGNITION: 11/06/2021: Overall cognitive status: Within functional limits for tasks assessed                    PALPATION: 11/06/2021: unable to palpate (cast on)   LE AROM/PROM:   A/PROM Right 11/06/21 Left 11/19/21 Left 11/25/2021 Measured in seated knee 90 deg  Hip flexion       Hip extension       Hip abduction       Hip adduction       Hip internal rotation       Hip external rotation       Knee flexion       Knee extension       Ankle dorsiflexion    A: 4* AROM 6  Ankle plantarflexion   A: 29*  AROM 40  Ankle inversion   A: 11*  AROM 14  Ankle eversion   A: 1*  AROM 20   (Blank rows = not tested)   LE MMT:  MMT Right 11/06/21 Left 11/06/21 Left 11/25/2021  Hip flexion 5/5 4/5   Hip extension       Hip abduction       Hip adduction       Hip internal rotation       Hip external rotation       Knee flexion 5/5 4/5   Knee extension 5/5 4/5   Ankle dorsiflexion 5/5   5/5  Ankle plantarflexion     2+/5  Ankle inversion 5/5   4/5  Ankle eversion 5/5   4/5   (Blank rows = not tested)   UE MMT: MMT Right 11/06/21 Left 11/06/21  shoulder flexion 4+/5 5/5  shoulder extension      shoulder abduction 4/5 5/5  shoulder adduction      shoulder internal rotation 5/5 5/5  shoulder external rotation 4/5 5/5  elbow flexion 5/5 5/5  elbow extension 5/5 5/5                                  LOWER EXTREMITY SPECIAL TESTS:  11/06/2021: none performed today   FUNCTIONAL TESTS:  11/06/2021: non performed today   GAIT: 11/06/2021: Distance walked: 15 ft Assistive device utilized:  FWW Level of assistance: CGA to Min A Comments: step to gait pattern c NWB on Lt leg  TODAY'S TREATMENT: 11/25/2021  Therex: Seated gastroc stretch c strap and knee straight Lt 30 sec x 5   Long sitting isometric holds 10 sec on/5 sec off x 8 each direction ( DF, PF, inversion, eversion)   Seated inversion towel pulls c 1 lb weight on it x 5   Seated eversion towel pulls c 1 lb weight on it x 5     Manual: Seated talocrural ap mob g4 for DF mobility gains, PROM   11/20/2021 left ankle AROM 10 reps 2 sets each dorsiflexion/plantarflexion, inversion/eversion, circles CW & CCW and alphabet.  Seated with LLE extended on her knee scooter.   Seated marbles 10 pick up move to target cup anterior for DF, medial for inversion & lateral for eversion.   PROM to phalanges with soft tissue mobilizations to flexor tendons with stretch.  PT worked on desensitizing lateral ankle with multimodal sensation 1 min ea. Hot pack, ice pack, light touch pillow case, rubbing with towel 2 reps each.   11/19/2021 PT updated HEP for AROM ankle noted below.  Pt performed left ankle AROM 10 reps 2 sets each dorsiflexion/plantarflexion, inversion/eversion, circles CW & CCW and alphabet.  Seated with LLE extended on her knee scooter.   Self-Care:  PT instructed with demo in using pillows to "tent" sheets off feet and between LEs in sidelying.  Pt verbalized understanding.  PT also instructed in use of towel roll on lateral thigh to decrease external rotation of LE. Pt verbalized understanding. Make sure to dry between toes after bathing. Needs to make sure drinking water to stay hydrated.    PATIENT EDUCATION:  11/11/2021:  Education details: reviewed HEP, safety with knee scooter including sit/stand, and husband assisting hopping with RW.  Person educated: Patient & spouse Education method: Explanation, Demonstration, Verbal cues Education comprehension: verbalized understanding and returned demonstration       HOME EXERCISE PROGRAM: Access Code: [email or web address] URL: [email or web address] Date: 11/19/2021 Prepared by: Jamey Reas  Exercises Seated Chair Push Ups - 1-2 x daily - 7 x weekly - 3 sets - 10 reps Seated Straight Leg Heel  Taps - 1-2 x daily - 7 x weekly - 3 sets - 10 reps Long Sitting Ankle Pumps - 2-3 x daily - 7 x weekly - 2 sets - 10 reps - 5 seconds hold Supine Ankle Inversion Eversion AROM - 2-3 x daily - 7 x weekly - 2 sets - 10 reps - 5 seconds hold Supine Ankle Circles - 2-3 x daily - 7 x weekly - 2 sets - 10 reps - 5 seconds hold Ankle Circles in Elevation - 2-3 x daily - 7 x weekly - 2 sets - 10 reps - 5 seconds hold     ASSESSMENT:   CLINICAL IMPRESSION: Patient has attended 5 visits.  Pt has reported minimal pain to this point in last week.  See objective data for updated information.  Pt still NWB at this time.  Pt to benefit from continued skilled PT services and adjustment to WB per MD visit result this week.    REHAB POTENTIAL: Good   CLINICAL DECISION MAKING: Stable/uncomplicated   EVALUATION COMPLEXITY: Low   GOALS: Goals reviewed with patient? Yes   SHORT TERM GOALS:   STG Name Target Date Goal status  1 Patient will demonstrate independent use of home exercise program to maintain progress from in clinic treatments.  11/27/2021 MET Assessed 11/25/2021  2 Patient will demonstrate modified independent ambulation c LRAD community distances (pending WB changes) 11/27/2021 On going Assessed 11/25/2021  3  Patient will demonstrate safe ascending/descending stairs c use of assistive device and rails to facilitate household navigation.  11/27/2021 On going Assessed 11/25/2021                                        LONG TERM GOALS:    LTG Name Target Date Goal status  1 Patient will demonstrate/report pain at worst less than or equal to 2/10 to facilitate minimal limitation in daily activity secondary to pain symptoms.  01/15/2022 On going Assessed 11/25/2021   2 Patient will demonstrate independent use of home exercise program to facilitate ability to maintain/progress functional gains from skilled physical therapy services.  01/15/2022 On going Assessed 11/25/2021  3 Patient will demonstrate FOTO outcome > or = 50% to indicated reduced disability due to condition. 01/15/2022 On going Assessed 11/25/2021  4 Patient will demonstrate Lt knee MMT 5/5, ankle 5/5 throughout to facilitate ability to perform usual standing, walking, stairs at PLOF s limitation due to symptoms. 01/15/2022 On going Assessed 11/25/2021  5 Patient will demonstrate independent ambulation community distances > 300 ft to return to normal (pending WB restrictions) 01/15/2022 On going Assessed 11/25/2021  6 Patient will demonstrate reciprocal gait pattern up/down stairs for household navigation.  01/15/2022 On going Assessed 11/25/2021  7  01/15/2022  NEW     PLAN: PT FREQUENCY: 2x/week   PT DURATION: 10 weeks   PLANNED INTERVENTIONS: Therapeutic exercises, Therapeutic activity, Neuro Muscular re-education, Balance training, Gait training, Patient/Family education, Joint mobilization, Stair training, DME instructions, Dry Needling, Electrical stimulation, Cryotherapy, Moist heat, Taping, Ultrasound, Ionotophoresis 87m/ml Dexamethasone, and Manual therapy.  All included unless contraindicated   PLAN FOR NEXT SESSION: Recheck MD changed WB restriction after that visit occurs.   MScot Jun PT, DPT, OCS, ATC 11/25/21  3:05 PM

## 2021-11-27 ENCOUNTER — Encounter: Payer: Self-pay | Admitting: Rehabilitative and Restorative Service Providers"

## 2021-11-27 ENCOUNTER — Ambulatory Visit (INDEPENDENT_AMBULATORY_CARE_PROVIDER_SITE_OTHER): Payer: No Typology Code available for payment source

## 2021-11-27 ENCOUNTER — Ambulatory Visit: Payer: Medicare HMO | Admitting: Rehabilitative and Restorative Service Providers"

## 2021-11-27 ENCOUNTER — Other Ambulatory Visit: Payer: Self-pay

## 2021-11-27 ENCOUNTER — Ambulatory Visit (INDEPENDENT_AMBULATORY_CARE_PROVIDER_SITE_OTHER): Payer: Medicare HMO | Admitting: Surgical

## 2021-11-27 DIAGNOSIS — M25572 Pain in left ankle and joints of left foot: Secondary | ICD-10-CM | POA: Diagnosis not present

## 2021-11-27 DIAGNOSIS — R262 Difficulty in walking, not elsewhere classified: Secondary | ICD-10-CM

## 2021-11-27 DIAGNOSIS — M6281 Muscle weakness (generalized): Secondary | ICD-10-CM | POA: Diagnosis not present

## 2021-11-27 DIAGNOSIS — S82892A Other fracture of left lower leg, initial encounter for closed fracture: Secondary | ICD-10-CM

## 2021-11-27 NOTE — Therapy (Signed)
OUTPATIENT PHYSICAL THERAPY TREATMENT NOTE Patient Name: Christie Davis MRN: [id number] DOB:[birth year], 76 y.o., female Today's Date: 11/27/2021  PCP: Harlan Stains, MD REFERRING PROVIDER: Meredith Pel, MD   PT End of Session - 11/27/21 1446     Visit Number 6    Number of Visits 20    Date for PT Re-Evaluation 01/15/22    Authorization Type [employer] $35 copay    Progress Note Due on Visit 10    PT Start Time 1423    PT Stop Time 1502    PT Time Calculation (min) 39 min    Activity Tolerance Patient tolerated treatment well    Behavior During Therapy WFL for tasks assessed/performed            Past Medical History:  Diagnosis Date   Chronic cough    off and on-   Hypertension    Medical history non-contributory    Wears glasses    Past Surgical History:  Procedure Laterality Date   COLONOSCOPY     ORIF HUMERUS FRACTURE Right 12/06/2012   Procedure: OPEN REDUCTION INTERNAL FIXATION (ORIF) PROXIMAL HUMERUS FRACTURE;  Surgeon: Nita Sells, MD;  Location: [facility];  Service: Orthopedics;  Laterality: Right;   RECTAL TUMOR BY PROCTOTOMY EXCISION  2007   TUBAL LIGATION     There are no problems to display for this patient.   REFERRING DIAG: Q58.483T (ICD-10-CM) - Closed fracture of left ankle, initial encounter  ONSET DATE: 10/22/2021  THERAPY DIAG:  Pain in left ankle and joints of left foot  Muscle weakness (generalized)  Difficulty in walking, not elsewhere classified  PERTINENT HISTORY: HTN.  Injury 10/22/2021 with ED visit on 10/23/2021  PRECAUTIONS: 11/19/21 NWB LLE, okay for PROM & AROM  SUBJECTIVE: No complaints to report since last visit.   PAIN:   Are you having pain? No NPRS scale:  0/10  Pain location: right knee Pain orientation: Right and Anterior  PAIN TYPE: throbbing Pain description: rarely Aggravating factors: nothing specific  Relieving factors: n/a  OBJECTIVE:  PATIENT SURVEYS:   11/06/2021: FOTO Ankle intake: 8   predicted:  50   COGNITION: 11/06/2021: Overall cognitive status: Within functional limits for tasks assessed                    PALPATION: 11/06/2021: unable to palpate (cast on)   LE AROM/PROM:   A/PROM Right 11/06/21 Left 11/19/21 Left 11/25/2021 Measured in seated knee 90 deg  Hip flexion       Hip extension       Hip abduction       Hip adduction       Hip internal rotation       Hip external rotation       Knee flexion       Knee extension       Ankle dorsiflexion    A: 4* AROM 6  Ankle plantarflexion   A: 29*  AROM 40  Ankle inversion   A: 11*  AROM 14  Ankle eversion   A: 1*  AROM 20   (Blank rows = not tested)   LE MMT:   MMT Right 11/06/21 Left 11/06/21 Left 11/25/2021  Hip flexion 5/5 4/5   Hip extension       Hip abduction       Hip adduction       Hip internal rotation       Hip external rotation  Knee flexion 5/5 4/5   Knee extension 5/5 4/5   Ankle dorsiflexion 5/5   5/5  Ankle plantarflexion     2+/5  Ankle inversion 5/5   4/5  Ankle eversion 5/5   4/5   (Blank rows = not tested)   UE MMT: MMT Right 11/06/21 Left 11/06/21  shoulder flexion 4+/5 5/5  shoulder extension      shoulder abduction 4/5 5/5  shoulder adduction      shoulder internal rotation 5/5 5/5  shoulder external rotation 4/5 5/5  elbow flexion 5/5 5/5  elbow extension 5/5 5/5                                  LOWER EXTREMITY SPECIAL TESTS:  11/06/2021: none performed today   FUNCTIONAL TESTS:  11/06/2021: non performed today   GAIT: 11/06/2021: Distance walked: 15 ft Assistive device utilized: FWW Level of assistance: CGA to Min A Comments: step to gait pattern c NWB on Lt leg  TODAY'S TREATMENT: 11/27/2021  Therex: Seated gastroc stretch c strap and knee straight Lt 30 sec x 5   Long sitting isometric holds 10 sec on/5 sec off x 8 each direction ( DF, PF, inversion, eversion)   Seated lvl 2 BAPS fwd/back, cicles cw, ccw 10x each  way Lt ankle      Manual: Seated talocrural ap mob g4 for DF mobility gains, PROM, medial and lateral subtalar joint mobs g3  11/25/2021  Therex: Seated gastroc stretch c strap and knee straight Lt 30 sec x 5   Long sitting isometric holds 10 sec on/5 sec off x 8 each direction ( DF, PF, inversion, eversion)   Seated inversion towel pulls c 1 lb weight on it x 5   Seated eversion towel pulls c 1 lb weight on it x 5     Manual: Seated talocrural ap mob g4 for DF mobility gains, PROM   11/20/2021 left ankle AROM 10 reps 2 sets each dorsiflexion/plantarflexion, inversion/eversion, circles CW & CCW and alphabet.  Seated with LLE extended on her knee scooter.   Seated marbles 10 pick up move to target cup anterior for DF, medial for inversion & lateral for eversion.   PROM to phalanges with soft tissue mobilizations to flexor tendons with stretch.  PT worked on desensitizing lateral ankle with multimodal sensation 1 min ea. Hot pack, ice pack, light touch pillow case, rubbing with towel 2 reps each.   11/19/2021 PT updated HEP for AROM ankle noted below.  Pt performed left ankle AROM 10 reps 2 sets each dorsiflexion/plantarflexion, inversion/eversion, circles CW & CCW and alphabet.  Seated with LLE extended on her knee scooter.   Self-Care:  PT instructed with demo in using pillows to "tent" sheets off feet and between LEs in sidelying.  Pt verbalized understanding.  PT also instructed in use of towel roll on lateral thigh to decrease external rotation of LE. Pt verbalized understanding. Make sure to dry between toes after bathing. Needs to make sure drinking water to stay hydrated.    PATIENT EDUCATION:  11/11/2021:  Education details: reviewed HEP, safety with knee scooter including sit/stand, and husband assisting hopping with RW.  Person educated: Patient & spouse Education method: Explanation, Demonstration, Verbal cues Education comprehension: verbalized understanding and  returned demonstration      HOME EXERCISE PROGRAM: Access Code: [email or web address] URL: [email or web address] Date: 11/19/2021 Prepared by: Jamey Reas  Exercises Seated Chair Push  Ups - 1-2 x daily - 7 x weekly - 3 sets - 10 reps Seated Straight Leg Heel Taps - 1-2 x daily - 7 x weekly - 3 sets - 10 reps Long Sitting Ankle Pumps - 2-3 x daily - 7 x weekly - 2 sets - 10 reps - 5 seconds hold Supine Ankle Inversion Eversion AROM - 2-3 x daily - 7 x weekly - 2 sets - 10 reps - 5 seconds hold Supine Ankle Circles - 2-3 x daily - 7 x weekly - 2 sets - 10 reps - 5 seconds hold Ankle Circles in Elevation - 2-3 x daily - 7 x weekly - 2 sets - 10 reps - 5 seconds hold     ASSESSMENT:   CLINICAL IMPRESSION: Pt returning to MD office for follow up following today's visit.  Pt gaining mobility in all directions as noted.  Inversion/eversion strength and activation control improving but still lacking    REHAB POTENTIAL: Good   CLINICAL DECISION MAKING: Stable/uncomplicated   EVALUATION COMPLEXITY: Low   GOALS: Goals reviewed with patient? Yes   SHORT TERM GOALS:   STG Name Target Date Goal status  1 Patient will demonstrate independent use of home exercise program to maintain progress from in clinic treatments.  11/27/2021 MET Assessed 11/25/2021  2 Patient will demonstrate modified independent ambulation c LRAD community distances (pending WB changes) 11/27/2021 On going Assessed 11/25/2021  3  Patient will demonstrate safe ascending/descending stairs c use of assistive device and rails to facilitate household navigation.  11/27/2021 On going Assessed 11/25/2021                                        LONG TERM GOALS:    LTG Name Target Date Goal status  1 Patient will demonstrate/report pain at worst less than or equal to 2/10 to facilitate minimal limitation in daily activity secondary to pain symptoms.  01/15/2022 On going Assessed 11/25/2021  2 Patient will demonstrate independent  use of home exercise program to facilitate ability to maintain/progress functional gains from skilled physical therapy services.  01/15/2022 On going Assessed 11/25/2021  3 Patient will demonstrate FOTO outcome > or = 50% to indicated reduced disability due to condition. 01/15/2022 On going Assessed 11/25/2021  4 Patient will demonstrate Lt knee MMT 5/5, ankle 5/5 throughout to facilitate ability to perform usual standing, walking, stairs at PLOF s limitation due to symptoms. 01/15/2022 On going Assessed 11/25/2021  5 Patient will demonstrate independent ambulation community distances > 300 ft to return to normal (pending WB restrictions) 01/15/2022 On going Assessed 11/25/2021  6 Patient will demonstrate reciprocal gait pattern up/down stairs for household navigation.  01/15/2022 On going Assessed 11/25/2021  7  01/15/2022  NEW     PLAN: PT FREQUENCY: 2x/week   PT DURATION: 10 weeks   PLANNED INTERVENTIONS: Therapeutic exercises, Therapeutic activity, Neuro Muscular re-education, Balance training, Gait training, Patient/Family education, Joint mobilization, Stair training, DME instructions, Dry Needling, Electrical stimulation, Cryotherapy, Moist heat, Taping, Ultrasound, Ionotophoresis 78m/ml Dexamethasone, and Manual therapy.  All included unless contraindicated   PLAN FOR NEXT SESSION: Recheck MD changed WB restriction after that visit occurs and progress accordingly.   MScot Jun PT, DPT, OCS, ATC 11/27/21  3:05 PM

## 2021-11-29 ENCOUNTER — Encounter: Payer: Self-pay | Admitting: Orthopedic Surgery

## 2021-11-29 NOTE — Progress Notes (Signed)
? ?  Post-Fracture Visit Note ?  ?Patient: Christie Davis           ?Date of Birth: 07-13-1946           ?MRN: [id number] ?Visit Date: 11/27/2021 ?PCP: Harlan Stains, MD ? ? ?Assessment & Plan: ? ?Chief Complaint:  ?Chief Complaint  ?Patient presents with  ? Left Ankle - Follow-up  ?   F/u left ankle fracture.  Date of injury 10/22/2021 ? ?  ? ?Visit Diagnoses:  ?1. Closed fracture of left ankle, initial encounter   ? ? ?Plan: Patient is a 76 year old female who presents s/p left ankle fracture on 10/22/2021.  She was treated initially with cast immobilization for about 3 weeks and has been in a fracture boot nonweightbearing since discontinuing the cast.  Currently reports that her pain is improved since last visit.  She has continued to ambulate with a knee scooter.  She denies any chest pain, shortness of breath, calf pain.  Taking aspirin for DVT prophylaxis.  On exam, she has intact ankle dorsiflexion and plantarflexion.  Palpable pedal pulses.  Minimal tenderness over the fracture site which patient describes as "0.5 out of 10".  Left ankle radiographs do demonstrate increased callus formation compared with last set of x-rays.  Plan is to allow her to weight-bear in the fracture boot with cane for ambulation assistance.  Follow-up in 2 weeks for clinical recheck with new radiographs at that time.  If those look appropriate, plan for transition to regular shoe and release most likely.  Follow-up with Dr. Marlou Sa. ? ?Follow-Up Instructions: No follow-ups on file.  ? ?Orders:  ?Orders Placed This Encounter  ?Procedures  ? XR Ankle Complete Left  ? ?No orders of the defined types were placed in this encounter. ? ? ?Imaging: ?No results found. ? ?PMFS History: ?There are no problems to display for this patient. ? ?Past Medical History:  ?Diagnosis Date  ? Chronic cough   ? off and on-  ? Hypertension   ? Medical history non-contributory   ? Wears glasses   ?  ?No family history on file.  ?Past Surgical History:   ?Procedure Laterality Date  ? COLONOSCOPY    ? ORIF HUMERUS FRACTURE Right 12/06/2012  ? Procedure: OPEN REDUCTION INTERNAL FIXATION (ORIF) PROXIMAL HUMERUS FRACTURE;  Surgeon: Nita Sells, MD;  Location: [facility];  Service: Orthopedics;  Laterality: Right;  ? RECTAL TUMOR BY PROCTOTOMY EXCISION  2007  ? TUBAL LIGATION    ? ?Social History  ? ?Occupational History  ? Not on file  ?Tobacco Use  ? Smoking status: Never  ? Smokeless tobacco: Never  ?Substance and Sexual Activity  ? Alcohol use: No  ? Drug use: No  ? Sexual activity: Not on file  ? ? ? ?

## 2021-12-02 ENCOUNTER — Ambulatory Visit: Payer: Medicare HMO | Admitting: Rehabilitative and Restorative Service Providers"

## 2021-12-02 ENCOUNTER — Other Ambulatory Visit: Payer: Self-pay

## 2021-12-02 ENCOUNTER — Encounter: Payer: Self-pay | Admitting: Rehabilitative and Restorative Service Providers"

## 2021-12-02 DIAGNOSIS — M25572 Pain in left ankle and joints of left foot: Secondary | ICD-10-CM | POA: Diagnosis not present

## 2021-12-02 DIAGNOSIS — R262 Difficulty in walking, not elsewhere classified: Secondary | ICD-10-CM | POA: Diagnosis not present

## 2021-12-02 DIAGNOSIS — M6281 Muscle weakness (generalized): Secondary | ICD-10-CM

## 2021-12-02 NOTE — Therapy (Signed)
OUTPATIENT PHYSICAL THERAPY TREATMENT NOTE Patient Name: Christie Davis MRN: [id number] DOB:1946/08/07, 76 y.o., female Today's Date: 12/02/2021  PCP: Christie Stains, MD REFERRING PROVIDER: Meredith Pel, MD   PT End of Session - 12/02/21 1432     Visit Number 7    Number of Visits 20    Date for PT Re-Evaluation 01/15/22    Authorization Type [employer] $35 copay    Progress Note Due on Visit 10    PT Start Time 1423    PT Stop Time 1504    PT Time Calculation (min) 41 min    Activity Tolerance Patient tolerated treatment well    Behavior During Therapy WFL for tasks assessed/performed             Past Medical History:  Diagnosis Date   Chronic cough    off and on-   Hypertension    Medical history non-contributory    Wears glasses    Past Surgical History:  Procedure Laterality Date   COLONOSCOPY     ORIF HUMERUS FRACTURE Right 12/06/2012   Procedure: OPEN REDUCTION INTERNAL FIXATION (ORIF) PROXIMAL HUMERUS FRACTURE;  Surgeon: Christie Sells, MD;  Location: [facility];  Service: Orthopedics;  Laterality: Right;   RECTAL TUMOR BY PROCTOTOMY EXCISION  2007   TUBAL LIGATION     There are no problems to display for this patient.   REFERRING DIAG: S93.734K (ICD-10-CM) - Closed fracture of left ankle, initial encounter  ONSET DATE: 10/22/2021  THERAPY DIAG:  Pain in left ankle and joints of left foot  Muscle weakness (generalized)  Difficulty in walking, not elsewhere classified  PERTINENT HISTORY: HTN.  Injury 10/22/2021 with ED visit on 10/23/2021  PRECAUTIONS: Able to WB in walker boot   SUBJECTIVE: Pt indicated using boot and walker in WB this weekend.   Arrived c quad cane in Rt UE.   PAIN:   Are you having pain? No NPRS scale:  0/10  Pain location:  Pain orientation: Lt ankle/foot PAIN TYPE:  Pain description: rarely Aggravating factors: nothing specific  Relieving factors: n/a  OBJECTIVE:  PATIENT  SURVEYS:  11/06/2021: FOTO Ankle intake: 8   predicted:  50   COGNITION: 11/06/2021: Overall cognitive status: Within functional limits for tasks assessed                    PALPATION: 11/06/2021: unable to palpate (cast on)   LE AROM/PROM:   A/PROM Right 11/06/21 Left 11/19/21 Left 11/25/2021 Measured in seated knee 90 deg  Hip flexion       Hip extension       Hip abduction       Hip adduction       Hip internal rotation       Hip external rotation       Knee flexion       Knee extension       Ankle dorsiflexion    A: 4* AROM 6  Ankle plantarflexion   A: 29*  AROM 40  Ankle inversion   A: 11*  AROM 14  Ankle eversion   A: 1*  AROM 20   (Blank rows = not tested)   LE MMT:   MMT Right 11/06/21 Left 11/06/21 Left 11/25/2021  Hip flexion 5/5 4/5   Hip extension       Hip abduction       Hip adduction       Hip internal rotation  Hip external rotation       Knee flexion 5/5 4/5   Knee extension 5/5 4/5   Ankle dorsiflexion 5/5   5/5  Ankle plantarflexion     2+/5  Ankle inversion 5/5   4/5  Ankle eversion 5/5   4/5   (Blank rows = not tested)   UE MMT: MMT Right 11/06/21 Left 11/06/21  shoulder flexion 4+/5 5/5  shoulder extension      shoulder abduction 4/5 5/5  shoulder adduction      shoulder internal rotation 5/5 5/5  shoulder external rotation 4/5 5/5  elbow flexion 5/5 5/5  elbow extension 5/5 5/5                                  LOWER EXTREMITY SPECIAL TESTS:  11/06/2021: none performed today   FUNCTIONAL TESTS:  11/06/2021: non performed today   GAIT: 12/04/2021:  Ambulation household distances in boot c quad cane in Rt UE c primarily step to gait pattern.   11/06/2021: Distance walked: 15 ft Assistive device utilized: FWW Level of assistance: CGA to Min A Comments: step to gait pattern c NWB on Lt leg  TODAY'S TREATMENT: 12/02/2021  Therex: Nustel lvl 5 10 mins UE/LE c boot on Lt leg   4 way green band 20x each direction Lt  ankle   Seated lvl 2 BAPS fwd/back, cicles cw, ccw 20x each way Lt ankle  TherAct: Verbal review of cane use in ambulation.  Functional activity including ramp ascending/descending x 1 each c CGA and verbal cues for sequencing for navigation.  Functional curb 6 inch c quad cane , CGA x 2.  Additional time required during performance for education.  Step up 4 inch x 10 each LE c single hand assist to replicate hand rail usage.     Manual: Seated talocrural ap mob g4 for DF mobility gains, PROM, medial and lateral subtalar joint mobs g3   11/27/2021  Therex: Seated gastroc stretch c strap and knee straight Lt 30 sec x 5   Long sitting isometric holds 10 sec on/5 sec off x 8 each direction ( DF, PF, inversion, eversion)   Seated lvl 2 BAPS fwd/back, cicles cw, ccw 10x each way Lt ankle      Manual: Seated talocrural ap mob g4 for DF mobility gains, PROM, medial and lateral subtalar joint mobs g3  11/25/2021  Therex: Seated gastroc stretch c strap and knee straight Lt 30 sec x 5   Long sitting isometric holds 10 sec on/5 sec off x 8 each direction ( DF, PF, inversion, eversion)   Seated inversion towel pulls c 1 lb weight on it x 5   Seated eversion towel pulls c 1 lb weight on it x 5     Manual: Seated talocrural ap mob g4 for DF mobility gains, PROM   11/20/2021 left ankle AROM 10 reps 2 sets each dorsiflexion/plantarflexion, inversion/eversion, circles CW & CCW and alphabet.  Seated with LLE extended on her knee scooter.   Seated marbles 10 pick up move to target cup anterior for DF, medial for inversion & lateral for eversion.   PROM to phalanges with soft tissue mobilizations to flexor tendons with stretch.  PT worked on desensitizing lateral ankle with multimodal sensation 1 min ea. Hot pack, ice pack, light touch pillow case, rubbing with towel 2 reps each.   PATIENT EDUCATION:  11/11/2021:  Education details: reviewed HEP,  safety with knee scooter including sit/stand, and  husband assisting hopping with RW.  Person educated: Patient & spouse Education method: Explanation, Demonstration, Verbal cues Education comprehension: verbalized understanding and returned demonstration      HOME EXERCISE PROGRAM: Access Code: [email or web address] URL: [email or web address] Date: 11/19/2021 Prepared by: Christie Davis  Exercises Seated Chair Push Ups - 1-2 x daily - 7 x weekly - 3 sets - 10 reps Seated Straight Leg Heel Taps - 1-2 x daily - 7 x weekly - 3 sets - 10 reps Long Sitting Ankle Pumps - 2-3 x daily - 7 x weekly - 2 sets - 10 reps - 5 seconds hold Supine Ankle Inversion Eversion AROM - 2-3 x daily - 7 x weekly - 2 sets - 10 reps - 5 seconds hold Supine Ankle Circles - 2-3 x daily - 7 x weekly - 2 sets - 10 reps - 5 seconds hold Ankle Circles in Elevation - 2-3 x daily - 7 x weekly - 2 sets - 10 reps - 5 seconds hold     ASSESSMENT:   CLINICAL IMPRESSION: Ambulation c step to gait pattern c Rt UE quad cane usage c boot on Lt c fair to good control on level surfaces.  Continued functional strengthening throughout BLE necessary to improve functional movement and control.     REHAB POTENTIAL: Good   CLINICAL DECISION MAKING: Stable/uncomplicated   EVALUATION COMPLEXITY: Low   GOALS: Goals reviewed with patient? Yes   SHORT TERM GOALS:   STG Name Target Date Goal status  1 Patient will demonstrate independent use of home exercise program to maintain progress from in clinic treatments.  11/27/2021 MET Assessed 11/25/2021  2 Patient will demonstrate modified independent ambulation c LRAD community distances (pending WB changes) 11/27/2021 Unable to reach goal prior to target date due to [facility] restrictions   3  Patient will demonstrate safe ascending/descending stairs c use of assistive device and rails to facilitate household navigation.  11/27/2021 MET                                         LONG TERM GOALS:    LTG Name Target Date Goal status  1  Patient will demonstrate/report pain at worst less than or equal to 2/10 to facilitate minimal limitation in daily activity secondary to pain symptoms.  01/15/2022 On going Assessed 11/25/2021  2 Patient will demonstrate independent use of home exercise program to facilitate ability to maintain/progress functional gains from skilled physical therapy services.  01/15/2022 On going Assessed 11/25/2021  3 Patient will demonstrate FOTO outcome > or = 50% to indicated reduced disability due to condition. 01/15/2022 On going Assessed 11/25/2021  4 Patient will demonstrate Lt knee MMT 5/5, ankle 5/5 throughout to facilitate ability to perform usual standing, walking, stairs at PLOF s limitation due to symptoms. 01/15/2022 On going Assessed 11/25/2021  5 Patient will demonstrate independent ambulation community distances > 300 ft to return to normal (pending WB restrictions) 01/15/2022 On going Assessed 11/25/2021  6 Patient will demonstrate reciprocal gait pattern up/down stairs for household navigation.  01/15/2022 On going Assessed 11/25/2021         PLAN: PT FREQUENCY: 2x/week   PT DURATION: 10 weeks   PLANNED INTERVENTIONS: Therapeutic exercises, Therapeutic activity, Neuro Muscular re-education, Balance training, Gait training, Patient/Family education, Joint mobilization, Stair training, DME instructions, Dry Needling, Electrical stimulation, Cryotherapy, Moist heat, Taping, Ultrasound,  Ionotophoresis 55m/ml Dexamethasone, and Manual therapy.  All included unless contraindicated   PLAN FOR NEXT SESSION:  WB in boot.  Functional ambulation/community integration training, strengthening   MScot Jun PT, DPT, OCS, ATC 12/02/21  3:05 PM

## 2021-12-04 ENCOUNTER — Ambulatory Visit: Payer: Medicare HMO | Admitting: Rehabilitative and Restorative Service Providers"

## 2021-12-04 ENCOUNTER — Encounter: Payer: Self-pay | Admitting: Rehabilitative and Restorative Service Providers"

## 2021-12-04 ENCOUNTER — Other Ambulatory Visit: Payer: Self-pay

## 2021-12-04 DIAGNOSIS — M25572 Pain in left ankle and joints of left foot: Secondary | ICD-10-CM | POA: Diagnosis not present

## 2021-12-04 DIAGNOSIS — M6281 Muscle weakness (generalized): Secondary | ICD-10-CM

## 2021-12-04 DIAGNOSIS — R262 Difficulty in walking, not elsewhere classified: Secondary | ICD-10-CM

## 2021-12-04 NOTE — Therapy (Signed)
?OUTPATIENT PHYSICAL THERAPY TREATMENT NOTE ?Patient Name: Christie Davis ?MRN: [id number] ?DOB:04-Sep-1946, 76 y.o., female ?Today's Date: 12/04/2021 ? ?PCP: Harlan Stains, MD ?REFERRING PROVIDER: Meredith Pel, MD ? ? PT End of Session - 12/04/21 1443   ? ? Visit Number 8   ? Number of Visits 20   ? Date for PT Re-Evaluation 01/15/22   ? Authorization Type [employer] Medicare $35 copay   ? Progress Note Due on Visit 10   ? PT Start Time 1424   ? PT Stop Time 0865   ? PT Time Calculation (min) 40 min   ? Activity Tolerance Patient tolerated treatment well   ? Behavior During Therapy [facility] for tasks assessed/performed   ? ?  ?  ? ?  ? ? ? ?Past Medical History:  ?Diagnosis Date  ? Chronic cough   ? off and on-  ? Hypertension   ? Medical history non-contributory   ? Wears glasses   ? ?Past Surgical History:  ?Procedure Laterality Date  ? COLONOSCOPY    ? ORIF HUMERUS FRACTURE Right 12/06/2012  ? Procedure: OPEN REDUCTION INTERNAL FIXATION (ORIF) PROXIMAL HUMERUS FRACTURE;  Surgeon: Nita Sells, MD;  Location: [place];  Service: Orthopedics;  Laterality: Right;  ? RECTAL TUMOR BY PROCTOTOMY EXCISION  2007  ? TUBAL LIGATION    ? ?There are no problems to display for this patient. ? ? ?REFERRING DIAG: H84.696E (ICD-10-CM) - Closed fracture of left ankle, initial encounter ? ?ONSET DATE: 10/22/2021 ? ?THERAPY DIAG:  ?Pain in left ankle and joints of left foot ? ?Muscle weakness (generalized) ? ?Difficulty in walking, not elsewhere classified ? ?PERTINENT HISTORY: HTN.  Injury 10/22/2021 with ED visit on 10/23/2021 ? ?PRECAUTIONS: Able to WB in walker boot  ? ?SUBJECTIVE: Pt stated feeling some irritation from boot at top of lower leg as well as in anterior part of ankle/foot area.  No other ankle specific pain reported today.  ? ?PAIN:  ? ?Are you having pain? No ?NPRS scale:  0/10  ?Pain location:  ?Pain orientation: Lt ankle/foot ?PAIN TYPE:  ?Pain description: rarely ?Aggravating factors:  nothing specific  ?Relieving factors: n/a ? ?OBJECTIVE:  ?PATIENT SURVEYS:  ?11/06/2021: FOTO Ankle intake: 8   predicted:  50 ?  ?COGNITION: ?11/06/2021: Overall cognitive status: Within functional limits for tasks assessed                  ?  ?PALPATION: ?11/06/2021: unable to palpate (cast on) ?  ?LE AROM/PROM: ?  ?A/PROM Right ?11/06/21 Left ?11/19/21 Left ?11/25/2021 ?Measured in seated knee 90 deg  ?Hip flexion       ?Hip extension       ?Hip abduction       ?Hip adduction       ?Hip internal rotation       ?Hip external rotation       ?Knee flexion       ?Knee extension       ?Ankle dorsiflexion    A: 4* AROM 6  ?Ankle plantarflexion   A: 29*  AROM 40  ?Ankle inversion   A: 11*  AROM 14  ?Ankle eversion   A: 1*  AROM 20  ? (Blank rows = not tested) ?  ?LE MMT: ?  ?MMT Right ?11/06/21 Left ?11/06/21 Left ?11/25/2021  ?Hip flexion 5/5 4/5   ?Hip extension       ?Hip abduction       ?Hip adduction       ?  Hip internal rotation       ?Hip external rotation       ?Knee flexion 5/5 4/5   ?Knee extension 5/5 4/5   ?Ankle dorsiflexion 5/5   5/5  ?Ankle plantarflexion     2+/5  ?Ankle inversion 5/5   4/5  ?Ankle eversion 5/5   4/5  ? (Blank rows = not tested) ?  ?UE MMT: ?MMT Right ?11/06/21 Left ?11/06/21  ?shoulder flexion 4+/5 5/5  ?shoulder extension      ?shoulder abduction 4/5 5/5  ?shoulder adduction      ?shoulder internal rotation 5/5 5/5  ?shoulder external rotation 4/5 5/5  ?elbow flexion 5/5 5/5  ?elbow extension 5/5 5/5  ?       ?       ?       ?       ?  ?  ?LOWER EXTREMITY SPECIAL TESTS:  ?11/06/2021: none performed today ?  ?FUNCTIONAL TESTS:  ?11/06/2021: non performed today ?  ?GAIT: ?12/04/2021:  Ambulation household distances in boot c quad cane in Rt UE c primarily step to gait pattern.  ? ?11/06/2021: Distance walked: 15 ft ?Assistive device utilized: FWW ?Level of assistance: CGA to Min A ?Comments: step to gait pattern c NWB on Lt leg ? ?TODAY'S TREATMENT: ?12/04/2021 ? ?Therex: Nustel lvl 5 10 mins UE/LE c boot  on Lt leg ?  4 way green band 2x15 each direction Lt ankle ?  Seated lvl 2 BAPS fwd/back 20x , cicles cw, ccw 20x each way Lt ankle ?  Seated Lt foot incline board stretch c knee bent 30 sec x 5 ?  Additional time spent in review of new HEP additions.  ? ?   ? ? ?12/02/2021 ? ?Therex: Nustel lvl 5 10 mins UE/LE c boot on Lt leg ?  4 way green band 20x each direction Lt ankle ?  Seated lvl 2 BAPS fwd/back, cicles cw, ccw 20x each way Lt ankle ? ?TherAct: Verbal review of cane use in ambulation.  Functional activity including ramp ascending/descending x 1 each c CGA and verbal cues for sequencing for navigation.  Functional curb 6 inch c quad cane , CGA x 2.  Additional time required during performance for education.  Step up 4 inch x 10 each LE c single hand assist to replicate hand rail usage.    ? ?Manual: Seated talocrural ap mob g4 for DF mobility gains, PROM, medial and lateral subtalar joint mobs g3 ? ? ?11/27/2021 ? ?Therex: Seated gastroc stretch c strap and knee straight Lt 30 sec x 5 ?  Long sitting isometric holds 10 sec on/5 sec off x 8 each direction ( DF, PF, inversion, eversion) ?  Seated lvl 2 BAPS fwd/back, cicles cw, ccw 10x each way Lt ankle ? ?   ? ?Manual: Seated talocrural ap mob g4 for DF mobility gains, PROM, medial and lateral subtalar joint mobs g3 ? ?11/25/2021 ? ?Therex: Seated gastroc stretch c strap and knee straight Lt 30 sec x 5 ?  Long sitting isometric holds 10 sec on/5 sec off x 8 each direction ( DF, PF, inversion, eversion) ?  Seated inversion towel pulls c 1 lb weight on it x 5 ?  Seated eversion towel pulls c 1 lb weight on it x 5 ?   ? ?Manual: Seated talocrural ap mob g4 for DF mobility gains, PROM ? ?PATIENT EDUCATION:  ?11/11/2021:  ?Education details: reviewed HEP, safety with knee scooter including sit/stand, and  husband assisting hopping with RW.  ?Person educated: Patient & spouse ?Education method: Explanation, Demonstration, Verbal cues ?Education comprehension: verbalized  understanding and returned demonstration ? ?  ?  ?HOME EXERCISE PROGRAM: ?Access Code: [email or web address] ?URL: [email or web address] ?Date: 12/04/2021 ?Prepared by: Scot Jun ? ?Exercises ?Seated Straight Leg Heel Taps - 1-2 x daily - 7 x weekly - 3 sets - 10 reps ?Long Sitting Ankle Pumps - 2-3 x daily - 7 x weekly - 2 sets - 10 reps - 5 seconds hold ?Supine Ankle Circles - 2-3 x daily - 7 x weekly - 2 sets - 10 reps - 5 seconds hold ?Long Sitting Ankle Eversion with Resistance - 2 x daily - 7 x weekly - 3 sets - 10 reps ?Long Sitting Ankle Inversion with Resistance - 2 x daily - 7 x weekly - 3 sets - 10 reps ?Long Sitting Ankle Dorsiflexion with Anchored Resistance (Mirrored) - 2 x daily - 7 x weekly - 3 sets - 10 reps ?Long Sitting Ankle Plantar Flexion with Resistance - 2 x daily - 7 x weekly - 3 sets - 10 reps ? ?  ?  ?ASSESSMENT: ?  ?CLINICAL IMPRESSION: ?Improving quality of ambulation c boot and cane use.  Pt to benefit from continued skilled PT services for strengthening gains and progression once WB outside of boot is allowed.  ? ?  ?REHAB POTENTIAL: Good ?  ?CLINICAL DECISION MAKING: Stable/uncomplicated ?  ?EVALUATION COMPLEXITY: Low ?  ?GOALS: ?Goals reviewed with patient? Yes ?  ?SHORT TERM GOALS: ?  ?STG Name Target Date Goal status  ?1 Patient will demonstrate independent use of home exercise program to maintain progress from in clinic treatments.  11/27/2021 MET ?Assessed 11/25/2021  ?2 Patient will demonstrate modified independent ambulation c LRAD community distances (pending WB changes) 11/27/2021 Unable to reach goal prior to target date due to [facility] restrictions   ?3  Patient will demonstrate safe ascending/descending stairs c use of assistive device and rails to facilitate household navigation.  11/27/2021 MET ?  ?         ?         ?         ?         ?  ?LONG TERM GOALS:  ?  ?LTG Name Target Date Goal status  ?1 Patient will demonstrate/report pain at worst less than or equal to 2/10 to  facilitate minimal limitation in daily activity secondary to pain symptoms.  01/15/2022 On going ?Assessed 11/25/2021  ?2 Patient will demonstrate independent use of home exercise program to facilitate ability

## 2021-12-09 ENCOUNTER — Encounter: Payer: Self-pay | Admitting: Rehabilitative and Restorative Service Providers"

## 2021-12-09 ENCOUNTER — Other Ambulatory Visit: Payer: Self-pay

## 2021-12-09 ENCOUNTER — Ambulatory Visit (INDEPENDENT_AMBULATORY_CARE_PROVIDER_SITE_OTHER): Payer: Medicare HMO | Admitting: Rehabilitative and Restorative Service Providers"

## 2021-12-09 DIAGNOSIS — M25572 Pain in left ankle and joints of left foot: Secondary | ICD-10-CM

## 2021-12-09 DIAGNOSIS — R262 Difficulty in walking, not elsewhere classified: Secondary | ICD-10-CM | POA: Diagnosis not present

## 2021-12-09 DIAGNOSIS — M6281 Muscle weakness (generalized): Secondary | ICD-10-CM | POA: Diagnosis not present

## 2021-12-09 NOTE — Therapy (Addendum)
?OUTPATIENT PHYSICAL THERAPY TREATMENT NOTE /Progress Note ?Patient Name: Christie Davis ?MRN: [id number] ?DOB:02/12/1946, 76 y.o., female ?Today's Date: 12/09/2021 ? ?PCP: Harlan Stains, MD ?REFERRING PROVIDER: Meredith Pel, MD ? ?Progress Note ?Reporting Period 11/06/2021 to 12/16/2021 ? ?See note below for Objective Data and Assessment of Progress/Goals.  ? ? ? ? ? PT End of Session - 12/09/21 1439   ? ? Visit Number 9   ? Number of Visits 20   ? Date for PT Re-Evaluation 01/15/22   ? Authorization Type [employer] Medicare $35 copay   ? Progress Note Due on Visit 19   ? PT Start Time 1430   ? PT Stop Time 1501   ? PT Time Calculation (min) 31 min   ? Activity Tolerance Patient tolerated treatment well   ? Behavior During Therapy [facility] for tasks assessed/performed   ? ?  ?  ? ?  ? ? ? ? ?Past Medical History:  ?Diagnosis Date  ? Chronic cough   ? off and on-  ? Hypertension   ? Medical history non-contributory   ? Wears glasses   ? ?Past Surgical History:  ?Procedure Laterality Date  ? COLONOSCOPY    ? ORIF HUMERUS FRACTURE Right 12/06/2012  ? Procedure: OPEN REDUCTION INTERNAL FIXATION (ORIF) PROXIMAL HUMERUS FRACTURE;  Surgeon: Nita Sells, MD;  Location: [place];  Service: Orthopedics;  Laterality: Right;  ? RECTAL TUMOR BY PROCTOTOMY EXCISION  2007  ? TUBAL LIGATION    ? ?There are no problems to display for this patient. ? ? ?REFERRING DIAG: T55.732K (ICD-10-CM) - Closed fracture of left ankle, initial encounter ? ?ONSET DATE: 10/22/2021 ? ?THERAPY DIAG:  ?Pain in left ankle and joints of left foot ? ?Muscle weakness (generalized) ? ?Difficulty in walking, not elsewhere classified ? ?PERTINENT HISTORY: HTN.  Injury 10/22/2021 with ED visit on 10/23/2021 ? ?PRECAUTIONS: Able to WB in walker boot  ? ?SUBJECTIVE: Pt indicated the pump part of the boot was better for irritations.  No pain upon arrival reported.  ? ?PAIN:  ? ?Are you having pain? No ?NPRS scale:  0/10  ?Pain location:   ?Pain orientation: Lt ankle/foot ?PAIN TYPE:  ?Pain description: rarely ?Aggravating factors: nothing specific  ?Relieving factors: n/a ? ?OBJECTIVE:  ?PATIENT SURVEYS:  ?12/09/2021: FOTO ankle update: 63% ?11/06/2021: FOTO Ankle intake: 8   predicted:  50 ?  ?COGNITION: ?11/06/2021: Overall cognitive status: Within functional limits for tasks assessed                  ?  ?PALPATION: ?11/06/2021: unable to palpate (cast on) ?  ?LE AROM/PROM: ?  ?A/PROM Right ?11/06/21 Left ?11/19/21 Left ?11/25/2021 ?Measured in seated knee 90 deg Left ?12/09/2021 ?Measured in seated knee 90 deg  ?Hip flexion        ?Hip extension        ?Hip abduction        ?Hip adduction        ?Hip internal rotation        ?Hip external rotation        ?Knee flexion        ?Knee extension        ?Ankle dorsiflexion    A: 4* AROM 6 AROM 12  ?Ankle plantarflexion   A: 29*  AROM 40 AROM 40  ?Ankle inversion   A: 11*  AROM 14 AROM 26  ?Ankle eversion   A: 1*  AROM 20 AROM 22  ? (Blank rows =  not tested) ?  ?LE MMT: ?  ?MMT Right ?11/06/21 Left ?11/06/21 Left ?11/25/2021 Left ?12/09/2021  ?Hip flexion 5/5 4/5    ?Hip extension        ?Hip abduction        ?Hip adduction        ?Hip internal rotation        ?Hip external rotation        ?Knee flexion 5/5 4/5    ?Knee extension 5/5 4/5    ?Ankle dorsiflexion 5/5   5/5 5/5  ?Ankle plantarflexion     2+/5 2+/5 (unable to assess in WB)  ?Ankle inversion 5/5   4/5 4/5  ?Ankle eversion 5/5   4/5 5/5  ? (Blank rows = not tested) ?  ?UE MMT: ?MMT Right ?11/06/21 Left ?11/06/21  ?shoulder flexion 4+/5 5/5  ?shoulder extension      ?shoulder abduction 4/5 5/5  ?shoulder adduction      ?shoulder internal rotation 5/5 5/5  ?shoulder external rotation 4/5 5/5  ?elbow flexion 5/5 5/5  ?elbow extension 5/5 5/5  ?       ?       ?       ?       ?  ?  ?LOWER EXTREMITY SPECIAL TESTS:  ?11/06/2021: none performed today ?  ?FUNCTIONAL TESTS:  ?11/06/2021: non performed today ?  ?GAIT: ?12/04/2021:  Ambulation household distances in boot c  quad cane in Rt UE c primarily step to gait pattern.  ? ?11/06/2021: Distance walked: 15 ft ?Assistive device utilized: FWW ?Level of assistance: CGA to Min A ?Comments: step to gait pattern c NWB on Lt leg ? ?TODAY'S TREATMENT: ?12/09/2021 ? ?Therex: Nustel lvl 5 10 mins UE/LE c boot on Lt leg ?  Inversion, eversion band 2x15 each direction Lt ankle ?  Seated lvl 2 BAPS fwd/back 20x , cicles cw, ccw 20x each way Lt ankle ?  Seated Lt foot incline board stretch c knee bent 30 sec x 5 ?  ? ?TherActivity Functional activity including ramp ascending/descending x 2 each c CGA and verbal cues for sequencing for navigation.  Functional curb 6 inch c quad cane. ? ?12/04/2021 ? ?Therex: Nustel lvl 5 10 mins UE/LE c boot on Lt leg ?  4 way green band 2x15 each direction Lt ankle ?  Seated lvl 2 BAPS fwd/back 20x , cicles cw, ccw 20x each way Lt ankle ?  Seated Lt foot incline board stretch c knee bent 30 sec x 5 ?  Additional time spent in review of new HEP additions.  ? ?12/02/2021 ? ?Therex: Nustel lvl 5 10 mins UE/LE c boot on Lt leg ?  4 way green band 20x each direction Lt ankle ?  Seated lvl 2 BAPS fwd/back, cicles cw, ccw 20x each way Lt ankle ? ?TherAct: Verbal review of cane use in ambulation.  Functional activity including ramp ascending/descending x 1 each c CGA and verbal cues for sequencing for navigation.  Functional curb 6 inch c quad cane , CGA x 2.  Additional time required during performance for education.  Step up 4 inch x 10 each LE c single hand assist to replicate hand rail usage.    ? ?Manual: Seated talocrural ap mob g4 for DF mobility gains, PROM, medial and lateral subtalar joint mobs g3 ? ? ?11/27/2021 ? ?Therex: Seated gastroc stretch c strap and knee straight Lt 30 sec x 5 ?  Long sitting isometric holds 10 sec on/5  sec off x 8 each direction ( DF, PF, inversion, eversion) ?  Seated lvl 2 BAPS fwd/back, cicles cw, ccw 10x each way Lt ankle ? ?   ? ?Manual: Seated talocrural ap mob g4 for DF mobility  gains, PROM, medial and lateral subtalar joint mobs g3 ? ? ?PATIENT EDUCATION:  ?11/11/2021:  ?Education details: reviewed HEP, safety with knee scooter including sit/stand, and husband assisting hopping with RW.  ?Person educated: Patient & spouse ?Education method: Explanation, Demonstration, Verbal cues ?Education comprehension: verbalized understanding and returned demonstration ? ?  ?  ?HOME EXERCISE PROGRAM: ?Access Code: [email or web address] ?URL: [email or web address] ?Date: 12/04/2021 ?Prepared by: Scot Jun ? ?Exercises ?Seated Straight Leg Heel Taps - 1-2 x daily - 7 x weekly - 3 sets - 10 reps ?Long Sitting Ankle Pumps - 2-3 x daily - 7 x weekly - 2 sets - 10 reps - 5 seconds hold ?Supine Ankle Circles - 2-3 x daily - 7 x weekly - 2 sets - 10 reps - 5 seconds hold ?Long Sitting Ankle Eversion with Resistance - 2 x daily - 7 x weekly - 3 sets - 10 reps ?Long Sitting Ankle Inversion with Resistance - 2 x daily - 7 x weekly - 3 sets - 10 reps ?Long Sitting Ankle Dorsiflexion with Anchored Resistance (Mirrored) - 2 x daily - 7 x weekly - 3 sets - 10 reps ?Long Sitting Ankle Plantar Flexion with Resistance - 2 x daily - 7 x weekly - 3 sets - 10 reps ? ?  ?  ?ASSESSMENT: ?  ?CLINICAL IMPRESSION: ?Pt has attended 9 visits overall during course of treatment.  Symptoms mild at most but rarely reported.  See objective data for updated information regarding current presentation.  Gains noted in active range and strength to this point and currently Pt has been able to ambulate c walker boot on Lt leg c quad cane within normal household and community navigation.  ? ?  ?REHAB POTENTIAL: Good ?  ?CLINICAL DECISION MAKING: Stable/uncomplicated ?  ?EVALUATION COMPLEXITY: Low ?  ?GOALS: ?Goals reviewed with patient? Yes ?  ?SHORT TERM GOALS: ?  ?STG Name Target Date Goal status  ?1 Patient will demonstrate independent use of home exercise program to maintain progress from in clinic treatments.  11/27/2021  MET ?Assessed 11/25/2021  ?2 Patient will demonstrate modified independent ambulation c LRAD community distances (pending WB changes) 11/27/2021 Unable to reach goal prior to target date due to [facility] restrictions   ?3  Pat

## 2021-12-11 ENCOUNTER — Encounter: Payer: Medicare HMO | Admitting: Rehabilitative and Restorative Service Providers"

## 2021-12-11 ENCOUNTER — Encounter: Payer: Self-pay | Admitting: Orthopedic Surgery

## 2021-12-11 ENCOUNTER — Ambulatory Visit (INDEPENDENT_AMBULATORY_CARE_PROVIDER_SITE_OTHER): Payer: No Typology Code available for payment source

## 2021-12-11 ENCOUNTER — Other Ambulatory Visit: Payer: Self-pay

## 2021-12-11 ENCOUNTER — Ambulatory Visit (INDEPENDENT_AMBULATORY_CARE_PROVIDER_SITE_OTHER): Payer: No Typology Code available for payment source | Admitting: Orthopedic Surgery

## 2021-12-11 DIAGNOSIS — S82892A Other fracture of left lower leg, initial encounter for closed fracture: Secondary | ICD-10-CM | POA: Diagnosis not present

## 2021-12-11 NOTE — Progress Notes (Signed)
? ?  Post-Op Visit Note ?  ?Patient: Christie Davis           ?Date of Birth: June 25, 1946           ?MRN: [id number] ?Visit Date: 12/11/2021 ?PCP: Harlan Stains, MD ? ? ?Assessment & Plan: ? ?Chief Complaint:  ?Chief Complaint  ?Patient presents with  ? Left Ankle - Routine Post Op  ? ?Visit Diagnoses:  ?1. Closed fracture of left ankle, initial encounter   ? ? ?Plan: [name] is a 76 year old patient with left ankle fracture.  Date of injury 10/22/2021.  She has been doing well.  Full weightbearing with cane and fracture boot.  On exam she has excellent range of motion of the ankle with no tenderness to palpation of the medial or lateral malleolus.  No fracture mobility at the fracture site.  Plan is to discontinue aspirin.  Negative Homans no calf tenderness today.  Out of work intermittently from February 1 until today for 15 days due to ankle fracture on the left-hand side.  Compression sock prescription written for minimal compression.  Follow-up with [place] as needed.  Recommended that she go out of the fracture boot and into a cane for 1 week and then regular shoes preferably sneakers for this following week and thereafter. ? ?Follow-Up Instructions: Return if symptoms worsen or fail to improve.  ? ?Orders:  ?Orders Placed This Encounter  ?Procedures  ? XR Ankle Complete Left  ? ?No orders of the defined types were placed in this encounter. ? ? ?Imaging: ?XR Ankle Complete Left ? ?Result Date: 12/11/2021 ?AP lateral mortise radiographs left ankle reviewed.  Lateral malleolar fracture has interval callus formation noted.  Syndesmosis and mortise symmetric and intact.  No new fracture.  Callus formation is present and increased from prior visit.  ? ?PMFS History: ?There are no problems to display for this patient. ? ?Past Medical History:  ?Diagnosis Date  ? Chronic cough   ? off and on-  ? Hypertension   ? Medical history non-contributory   ? Wears glasses   ?  ?History reviewed. No pertinent family history.  ?Past  Surgical History:  ?Procedure Laterality Date  ? COLONOSCOPY    ? ORIF HUMERUS FRACTURE Right 12/06/2012  ? Procedure: OPEN REDUCTION INTERNAL FIXATION (ORIF) PROXIMAL HUMERUS FRACTURE;  Surgeon: Nita Sells, MD;  Location: [place];  Service: Orthopedics;  Laterality: Right;  ? RECTAL TUMOR BY PROCTOTOMY EXCISION  2007  ? TUBAL LIGATION    ? ?Social History  ? ?Occupational History  ? Not on file  ?Tobacco Use  ? Smoking status: Never  ? Smokeless tobacco: Never  ?Substance and Sexual Activity  ? Alcohol use: No  ? Drug use: No  ? Sexual activity: Not on file  ? ? ? ?

## 2021-12-16 ENCOUNTER — Other Ambulatory Visit: Payer: Self-pay

## 2021-12-16 ENCOUNTER — Ambulatory Visit (INDEPENDENT_AMBULATORY_CARE_PROVIDER_SITE_OTHER): Payer: No Typology Code available for payment source | Admitting: Rehabilitative and Restorative Service Providers"

## 2021-12-16 ENCOUNTER — Encounter: Payer: Self-pay | Admitting: Rehabilitative and Restorative Service Providers"

## 2021-12-16 DIAGNOSIS — M6281 Muscle weakness (generalized): Secondary | ICD-10-CM | POA: Diagnosis not present

## 2021-12-16 DIAGNOSIS — R262 Difficulty in walking, not elsewhere classified: Secondary | ICD-10-CM

## 2021-12-16 DIAGNOSIS — M25572 Pain in left ankle and joints of left foot: Secondary | ICD-10-CM | POA: Diagnosis not present

## 2021-12-16 NOTE — Addendum Note (Signed)
Addended by: Scot Jun B on: 12/16/2021 03:38 PM ? ? Modules accepted: Orders ? ?

## 2021-12-16 NOTE — Therapy (Addendum)
?OUTPATIENT PHYSICAL THERAPY TREATMENT NOTE ?Patient Name: Christie Davis ?MRN: [id number] ?DOB:June 03, 1946, 76 y.o., female ?Today's Date: 12/16/2021 ? ?PCP: Harlan Stains, MD ?REFERRING PROVIDER: Meredith Pel, MD ? ? PT End of Session - 12/16/21 1433   ? ? Visit Number 10   ? Number of Visits 24   ? Date for PT Re-Evaluation 02/10/22   ? Authorization Type WC approved 12 visits   ? Authorization - Visit Number 10   ? Authorization - Number of Visits 12   ? Progress Note Due on Visit 19   ? PT Start Time 1428   ? PT Stop Time 1507   ? PT Time Calculation (min) 39 min   ? Activity Tolerance Patient tolerated treatment well   ? Behavior During Therapy [facility] for tasks assessed/performed   ? ?  ?  ? ?  ? ? ? ? ? ?Past Medical History:  ?Diagnosis Date  ? Chronic cough   ? off and on-  ? Hypertension   ? Medical history non-contributory   ? Wears glasses   ? ?Past Surgical History:  ?Procedure Laterality Date  ? COLONOSCOPY    ? ORIF HUMERUS FRACTURE Right 12/06/2012  ? Procedure: OPEN REDUCTION INTERNAL FIXATION (ORIF) PROXIMAL HUMERUS FRACTURE;  Surgeon: Nita Sells, MD;  Location: [place];  Service: Orthopedics;  Laterality: Right;  ? RECTAL TUMOR BY PROCTOTOMY EXCISION  2007  ? TUBAL LIGATION    ? ?There are no problems to display for this patient. ? ? ?REFERRING DIAG: G64.403K (ICD-10-CM) - Closed fracture of left ankle, initial encounter ? ?ONSET DATE: 10/22/2021 ? ?THERAPY DIAG:  ?Pain in left ankle and joints of left foot ? ?Muscle weakness (generalized) ? ?Difficulty in walking, not elsewhere classified ? ?PERTINENT HISTORY: HTN.  Injury 10/22/2021 with ED visit on 10/23/2021 ? ?PRECAUTIONS: None ? ?SUBJECTIVE: Pt indicated WB outside of boot since last week MD visit.  Pt indicated she has a brace on Lt ankle now for standing.  Pt indicated working and feeling a little tired today.  No pain upon arrival.  ? ?PAIN:  ? ?Are you having pain? No ?NPRS scale:  0/10  ?Pain location:   ?Pain orientation: Lt ankle/foot ?PAIN TYPE:  ?Pain description: rarely ?Aggravating factors: nothing specific  ?Relieving factors: n/a ? ?OBJECTIVE:  ?PATIENT SURVEYS:  ?12/09/2021: FOTO ankle update: 63% ?11/06/2021: FOTO Ankle intake: 8   predicted:  50 ?  ?COGNITION: ?11/06/2021: Overall cognitive status: Within functional limits for tasks assessed                  ?  ?PALPATION: ?11/06/2021: unable to palpate (cast on) ?  ?LE AROM/PROM: ?  ?A/PROM Right ?11/06/21 Left ?11/19/21 Left ?11/25/2021 ?Measured in seated knee 90 deg Left ?12/09/2021 ?Measured in seated knee 90 deg  ?Hip flexion        ?Hip extension        ?Hip abduction        ?Hip adduction        ?Hip internal rotation        ?Hip external rotation        ?Knee flexion        ?Knee extension        ?Ankle dorsiflexion    A: 4* AROM 6 AROM 12  ?Ankle plantarflexion   A: 29*  AROM 40 AROM 40  ?Ankle inversion   A: 11*  AROM 14 AROM 26  ?Ankle eversion   A: 1*  AROM 20 AROM 22  ? (Blank rows = not tested) ?  ?LE MMT: ?  ?MMT Right ?11/06/21 Left ?11/06/21 Left ?11/25/2021 Left ?12/09/2021  ?Hip flexion 5/5 4/5    ?Hip extension        ?Hip abduction        ?Hip adduction        ?Hip internal rotation        ?Hip external rotation        ?Knee flexion 5/5 4/5    ?Knee extension 5/5 4/5    ?Ankle dorsiflexion 5/5   5/5 5/5  ?Ankle plantarflexion     2+/5 2+/5 (unable to assess in WB)  ?Ankle inversion 5/5   4/5 4/5  ?Ankle eversion 5/5   4/5 5/5  ? (Blank rows = not tested) ?  ?UE MMT: ?MMT Right ?11/06/21 Left ?11/06/21  ?shoulder flexion 4+/5 5/5  ?shoulder extension      ?shoulder abduction 4/5 5/5  ?shoulder adduction      ?shoulder internal rotation 5/5 5/5  ?shoulder external rotation 4/5 5/5  ?elbow flexion 5/5 5/5  ?elbow extension 5/5 5/5  ?       ?       ?       ?       ?  ?  ?LOWER EXTREMITY SPECIAL TESTS:  ?11/06/2021: none performed today ?  ?FUNCTIONAL TESTS:  ?11/06/2021: non performed today ?  ?GAIT: ?12/16/2021: Ambulation without boot c quad cane in Rt  UE ? ?12/04/2021:  Ambulation household distances in boot c quad cane in Rt UE c primarily step to gait pattern.  ? ?11/06/2021: Distance walked: 15 ft ?Assistive device utilized: FWW ?Level of assistance: CGA to Min A ?Comments: step to gait pattern c NWB on Lt leg ? ?TODAY'S TREATMENT: ?12/16/2021 ? ?Therex: Recumbent bike lvl 2 10 mins ?  Standing heel raise x 10 ?  Seated Lt foot incline board stretch c knee bent 30 sec x 5 ?   ? ?NeuroReed Tandem stance 1 min x 1 bilateral ?  Lt leg balance c tennis ball rolls fwd/back 2 x 10 c Rt leg ? ?TherActivity Step up on Lt leg 2 x 10 4 inch step ?  Leg press double leg x 15 75 lbs, single leg Lt x15 31 lbs ?  Instruction and performance of flight of stairs c cues for Rt leg up first, Lt leg down first to improve ability.  ? ?12/09/2021 ? ?Therex: Nustel lvl 5 10 mins UE/LE c boot on Lt leg ?  Inversion, eversion band 2x15 each direction Lt ankle ?  Seated lvl 2 BAPS fwd/back 20x , cicles cw, ccw 20x each way Lt ankle ?  Seated Lt foot incline board stretch c knee bent 30 sec x 5 ?  ? ?TherActivity Functional activity including ramp ascending/descending x 2 each c CGA and verbal cues for sequencing for navigation.  Functional curb 6 inch c quad cane. ? ?12/04/2021 ? ?Therex: Nustel lvl 5 10 mins UE/LE c boot on Lt leg ?  4 way green band 2x15 each direction Lt ankle ?  Seated lvl 2 BAPS fwd/back 20x , cicles cw, ccw 20x each way Lt ankle ?  Seated Lt foot incline board stretch c knee bent 30 sec x 5 ?  Additional time spent in review of new HEP additions.  ? ?12/02/2021 ? ?Therex: Nustel lvl 5 10 mins UE/LE c boot on Lt leg ?  4 way green band 20x  each direction Lt ankle ?  Seated lvl 2 BAPS fwd/back, cicles cw, ccw 20x each way Lt ankle ? ?TherAct: Verbal review of cane use in ambulation.  Functional activity including ramp ascending/descending x 1 each c CGA and verbal cues for sequencing for navigation.  Functional curb 6 inch c quad cane , CGA x 2.  Additional time  required during performance for education.  Step up 4 inch x 10 each LE c single hand assist to replicate hand rail usage.    ? ?Manual: Seated talocrural ap mob g4 for DF mobility gains, PROM, medial and lateral subtalar joint mobs g3 ? ? ?PATIENT EDUCATION:  ?12/16/2021:  ?Education details: HEP progression ?Person educated: Patient ?Education method: Explanation, Demonstration, Verbal cues ?Education comprehension: verbalized understanding and returned demonstration ? ?  ?  ?HOME EXERCISE PROGRAM: ?Access Code: [email or web address] ?URL: [email or web address] ?Date: 12/16/2021 ?Prepared by: Scot Jun ? ?Exercises ?- Seated Straight Leg Heel Taps  - 1-2 x daily - 7 x weekly - 3 sets - 10 reps ?- Long Sitting Ankle Eversion with Resistance  - 2 x daily - 7 x weekly - 3 sets - 10 reps ?- Long Sitting Ankle Inversion with Resistance  - 2 x daily - 7 x weekly - 3 sets - 10 reps ?- Long Sitting Ankle Dorsiflexion with Anchored Resistance (Mirrored)  - 2 x daily - 7 x weekly - 3 sets - 10 reps ?- Standing Gastroc Stretch on Step  - 2 x daily - 7 x weekly - 1 sets - 5 reps - 30 sec hold ?- Single Leg Stance  - 1 x daily - 7 x weekly - 1 sets - 5 reps - 20-30 hold ?- Tandem Stance  - 1 x daily - 7 x weekly - 1 sets - 5 reps - 20-30 hold ?- Standing Heel Raise  - 1 x daily - 7 x weekly - 1-2 sets - 10 reps ? ?  ?  ?ASSESSMENT: ?  ?CLINICAL IMPRESSION: ?Of note today, Pt had information regarding Worker's Compensation approval for 12 visits total (today being #10).  Pt able to ambulate and WB outside of boot per MD last visit.  Additional WB activity added today in clinic and to HEP to promote improved balance/strength for progressive mobility and ambulation improvements towards independent performance.   ? ?  ?REHAB POTENTIAL: Good ?  ?CLINICAL DECISION MAKING: Stable/uncomplicated ?  ?EVALUATION COMPLEXITY: Low ?  ?GOALS: ?Goals reviewed with patient? Yes ?  ?SHORT TERM GOALS: ?  ?STG Name Target Date Goal status   ?1 Patient will demonstrate independent use of home exercise program to maintain progress from in clinic treatments.  11/27/2021 MET ?Assessed 11/25/2021  ?2 Patient will demonstrate modified independent ambulati

## 2021-12-18 ENCOUNTER — Ambulatory Visit (INDEPENDENT_AMBULATORY_CARE_PROVIDER_SITE_OTHER): Payer: No Typology Code available for payment source | Admitting: Rehabilitative and Restorative Service Providers"

## 2021-12-18 ENCOUNTER — Encounter: Payer: Self-pay | Admitting: Rehabilitative and Restorative Service Providers"

## 2021-12-18 DIAGNOSIS — R262 Difficulty in walking, not elsewhere classified: Secondary | ICD-10-CM | POA: Diagnosis not present

## 2021-12-18 DIAGNOSIS — M6281 Muscle weakness (generalized): Secondary | ICD-10-CM | POA: Diagnosis not present

## 2021-12-18 DIAGNOSIS — M25572 Pain in left ankle and joints of left foot: Secondary | ICD-10-CM | POA: Diagnosis not present

## 2021-12-18 NOTE — Therapy (Signed)
?OUTPATIENT PHYSICAL THERAPY TREATMENT NOTE ?Patient Name: Christie Davis ?MRN: [id number] ?DOB:11/06/1945, 76 y.o., female ?Today's Date: 12/18/2021 ? ?PCP: Harlan Stains, MD ?REFERRING PROVIDER: Meredith Pel, MD ? ? PT End of Session - 12/18/21 1428   ? ? Visit Number 11   ? Number of Visits 24   ? Date for PT Re-Evaluation 02/10/22   ? Authorization Type WC approved 12 visits   ? Authorization - Visit Number 11   ? Authorization - Number of Visits 12   ? Progress Note Due on Visit 19   ? PT Start Time 1429   ? PT Stop Time 1510   ? PT Time Calculation (min) 41 min   ? Activity Tolerance Patient tolerated treatment well   ? Behavior During Therapy [facility] for tasks assessed/performed   ? ?  ?  ? ?  ? ? ? ? ? ? ?Past Medical History:  ?Diagnosis Date  ? Chronic cough   ? off and on-  ? Hypertension   ? Medical history non-contributory   ? Wears glasses   ? ?Past Surgical History:  ?Procedure Laterality Date  ? COLONOSCOPY    ? ORIF HUMERUS FRACTURE Right 12/06/2012  ? Procedure: OPEN REDUCTION INTERNAL FIXATION (ORIF) PROXIMAL HUMERUS FRACTURE;  Surgeon: Nita Sells, MD;  Location: [place];  Service: Orthopedics;  Laterality: Right;  ? RECTAL TUMOR BY PROCTOTOMY EXCISION  2007  ? TUBAL LIGATION    ? ?There are no problems to display for this patient. ? ? ?REFERRING DIAG: J15.520E (ICD-10-CM) - Closed fracture of left ankle, initial encounter ? ?ONSET DATE: 10/22/2021 ? ?THERAPY DIAG:  ?Pain in left ankle and joints of left foot ? ?Muscle weakness (generalized) ? ?Difficulty in walking, not elsewhere classified ? ?PERTINENT HISTORY: HTN.  Injury 10/22/2021 with ED visit on 10/23/2021 ? ?PRECAUTIONS: None ? ?SUBJECTIVE: Pt indicated no pain complaints upon arrival today.  Walking with cane in hand but not on ground.  Some mild complaints off and on from calf area laterally.  ? ?PAIN:  ? ?Are you having pain? No ?NPRS scale:  0/10  ?Pain location:  ?Pain orientation: Lt  ankle/foot ?PAIN TYPE:  ?Pain description: rarely ?Aggravating factors: nothing specific  ?Relieving factors: n/a ? ?OBJECTIVE:  ?PATIENT SURVEYS:  ?12/09/2021: FOTO ankle update: 63% ?11/06/2021: FOTO Ankle intake: 8   predicted:  50 ?  ?COGNITION: ?11/06/2021: Overall cognitive status: Within functional limits for tasks assessed                  ?  ?PALPATION: ?11/06/2021: unable to palpate (cast on) ?  ?LE AROM/PROM: ?  ?A/PROM Right ?11/06/21 Left ?11/19/21 Left ?11/25/2021 ?Measured in seated knee 90 deg Left ?12/09/2021 ?Measured in seated knee 90 deg  ?Hip flexion        ?Hip extension        ?Hip abduction        ?Hip adduction        ?Hip internal rotation        ?Hip external rotation        ?Knee flexion        ?Knee extension        ?Ankle dorsiflexion    A: 4* AROM 6 AROM 12  ?Ankle plantarflexion   A: 29*  AROM 40 AROM 40  ?Ankle inversion   A: 11*  AROM 14 AROM 26  ?Ankle eversion   A: 1*  AROM 20 AROM 22  ? (Blank rows =  not tested) ?  ?LE MMT: ?  ?MMT Right ?11/06/21 Left ?11/06/21 Left ?11/25/2021 Left ?12/09/2021 Left ?12/18/2021  ?Hip flexion 5/5 4/5     ?Hip extension         ?Hip abduction         ?Hip adduction         ?Hip internal rotation         ?Hip external rotation         ?Knee flexion 5/5 4/5     ?Knee extension 5/5 4/5     ?Ankle dorsiflexion 5/5   5/5 5/5   ?Ankle plantarflexion     2+/5 2+/5 (unable to assess in WB) 2+/5 - unable to perform single leg heel raise  ?Ankle inversion 5/5   4/5 4/5   ?Ankle eversion 5/5   4/5 5/5   ? (Blank rows = not tested) ?  ?UE MMT: ?MMT Right ?11/06/21 Left ?11/06/21  ?shoulder flexion 4+/5 5/5  ?shoulder extension      ?shoulder abduction 4/5 5/5  ?shoulder adduction      ?shoulder internal rotation 5/5 5/5  ?shoulder external rotation 4/5 5/5  ?elbow flexion 5/5 5/5  ?elbow extension 5/5 5/5  ?       ?       ?       ?       ?  ?  ?LOWER EXTREMITY SPECIAL TESTS:  ?11/06/2021: none performed today ?  ?FUNCTIONAL TESTS:  ?11/06/2021: non performed today ?   ?GAIT: ?12/16/2021: Ambulation without boot c quad cane in Rt UE ? ?12/04/2021:  Ambulation household distances in boot c quad cane in Rt UE c primarily step to gait pattern.  ? ?11/06/2021: Distance walked: 15 ft ?Assistive device utilized: FWW ?Level of assistance: CGA to Min A ?Comments: step to gait pattern c NWB on Lt leg ? ?TODAY'S TREATMENT: ?12/18/2021 ? ?Therex: Recumbent bike lvl 3 10 mins ?  Standing double leg heel raise 2 x 10 (hands on bar) ?  Incline board stretch 30 sec x 5 bilateral ?   ? ?NeuroReed SLS c toe tap fwd/lateral/reverse light touch contralateral leg x 10 bilateral ?  Double leg fitter rocker board fwd/back light touch control focus x 20 ? ?TherActivity Step up and over on Lt leg x 15 4 inch step ?  Leg press double leg x 15 75 lbs, single leg Lt x15 31 lbs ?  In ? ?12/16/2021 ? ?Therex: Recumbent bike lvl 2 10 mins ?  Inversion, eversion band 2x15 each direction Lt ankle ?  Seated lvl 2 BAPS fwd/back 20x , cicles cw, ccw 20x each way Lt ankle ?  Seated Lt foot incline board stretch c knee bent 30 sec x 5 ?   ? ?NeuroReed Tandem stance 1 min x 1 bilateral ?  Lt leg balance c tennis ball rolls fwd/back 2 x 10 c Rt leg ? ?TherActivity Step up on Lt leg 2 x 10 4 inch step ?  Leg press double leg x 15 75 lbs, single leg Lt x15 31 lbs ?  Instruction and performance of flight of stairs c cues for Rt leg up first, Lt leg down first to improve ability.  ? ?12/09/2021 ? ?Therex: Nustel lvl 5 10 mins UE/LE c boot on Lt leg ?  Inversion, eversion band 2x15 each direction Lt ankle ?  Seated lvl 2 BAPS fwd/back 20x , cicles cw, ccw 20x each way Lt ankle ?  Seated Lt foot incline  board stretch c knee bent 30 sec x 5 ?  ? ?TherActivity Functional activity including ramp ascending/descending x 2 each c CGA and verbal cues for sequencing for navigation.  Functional curb 6 inch c quad cane. ? ?12/04/2021 ? ?Therex: Nustel lvl 5 10 mins UE/LE c boot on Lt leg ?  4 way green band 2x15 each direction Lt  ankle ?  Seated lvl 2 BAPS fwd/back 20x , cicles cw, ccw 20x each way Lt ankle ?  Seated Lt foot incline board stretch c knee bent 30 sec x 5 ?  Additional time spent in review of new HEP additions.  ? ? ?PATIENT EDUCATION:  ?12/16/2021:  ?Education details: HEP progression ?Person educated: Patient ?Education method: Explanation, Demonstration, Verbal cues ?Education comprehension: verbalized understanding and returned demonstration ? ?  ?  ?HOME EXERCISE PROGRAM: ?Access Code: [email or web address] ?URL: [email or web address] ?Date: 12/16/2021 ?Prepared by: Scot Jun ? ?Exercises ?- Seated Straight Leg Heel Taps  - 1-2 x daily - 7 x weekly - 3 sets - 10 reps ?- Long Sitting Ankle Eversion with Resistance  - 2 x daily - 7 x weekly - 3 sets - 10 reps ?- Long Sitting Ankle Inversion with Resistance  - 2 x daily - 7 x weekly - 3 sets - 10 reps ?- Long Sitting Ankle Dorsiflexion with Anchored Resistance (Mirrored)  - 2 x daily - 7 x weekly - 3 sets - 10 reps ?- Standing Gastroc Stretch on Step  - 2 x daily - 7 x weekly - 1 sets - 5 reps - 30 sec hold ?- Single Leg Stance  - 1 x daily - 7 x weekly - 1 sets - 5 reps - 20-30 hold ?- Tandem Stance  - 1 x daily - 7 x weekly - 1 sets - 5 reps - 20-30 hold ?- Standing Heel Raise  - 1 x daily - 7 x weekly - 1-2 sets - 10 reps ? ?  ?  ?ASSESSMENT: ?  ?CLINICAL IMPRESSION: ?Pt demonstrated progression in stair navigation training with shorter 4 inch step today but able to control lowering fair to good.  Ambulation throughout clinic independently. PF strength deficits present as noted in muscle testing.  Continued skilled PT services indicated to help continue progression.  ? ?  ?REHAB POTENTIAL: Good ?  ?CLINICAL DECISION MAKING: Stable/uncomplicated ?  ?EVALUATION COMPLEXITY: Low ?  ?GOALS: ?Goals reviewed with patient? Yes ?  ?SHORT TERM GOALS: ?  ?STG Name Target Date Goal status  ?1 Patient will demonstrate independent use of home exercise program to maintain progress  from in clinic treatments.  11/27/2021 MET ?Assessed 11/25/2021  ?2 Patient will demonstrate modified independent ambulation c LRAD community distances (pending WB changes) 11/27/2021 Unable to reach goal prior to target date due to [facility] restric

## 2021-12-23 ENCOUNTER — Ambulatory Visit (INDEPENDENT_AMBULATORY_CARE_PROVIDER_SITE_OTHER): Payer: No Typology Code available for payment source | Admitting: Rehabilitative and Restorative Service Providers"

## 2021-12-23 ENCOUNTER — Encounter: Payer: Self-pay | Admitting: Rehabilitative and Restorative Service Providers"

## 2021-12-23 DIAGNOSIS — M6281 Muscle weakness (generalized): Secondary | ICD-10-CM

## 2021-12-23 DIAGNOSIS — R262 Difficulty in walking, not elsewhere classified: Secondary | ICD-10-CM

## 2021-12-23 DIAGNOSIS — M25572 Pain in left ankle and joints of left foot: Secondary | ICD-10-CM

## 2021-12-23 NOTE — Therapy (Signed)
?OUTPATIENT PHYSICAL THERAPY TREATMENT NOTE ?Patient Name: Christie Davis ?MRN: [id number] ?DOB:1945-09-28, 76 y.o., female ?Today's Date: 12/23/2021 ? ?PCP: Harlan Stains, MD ?REFERRING PROVIDER: Meredith Pel, MD ? ? PT End of Session - 12/23/21 1524   ? ? Visit Number 12   ? Number of Visits 24   ? Date for PT Re-Evaluation 02/10/22   ? Authorization Type WC approved 12 visits   ? Authorization - Visit Number 1   ? Authorization - Number of Visits 12   ? Progress Note Due on Visit 19   ? PT Start Time 6415   ? PT Stop Time 8309   ? PT Time Calculation (min) 39 min   ? Activity Tolerance Patient tolerated treatment well   ? Behavior During Therapy [facility] for tasks assessed/performed   ? ?  ?  ? ?  ? ? ? ? ? ? ? ?Past Medical History:  ?Diagnosis Date  ? Chronic cough   ? off and on-  ? Hypertension   ? Medical history non-contributory   ? Wears glasses   ? ?Past Surgical History:  ?Procedure Laterality Date  ? COLONOSCOPY    ? ORIF HUMERUS FRACTURE Right 12/06/2012  ? Procedure: OPEN REDUCTION INTERNAL FIXATION (ORIF) PROXIMAL HUMERUS FRACTURE;  Surgeon: Nita Sells, MD;  Location: [place];  Service: Orthopedics;  Laterality: Right;  ? RECTAL TUMOR BY PROCTOTOMY EXCISION  2007  ? TUBAL LIGATION    ? ?There are no problems to display for this patient. ? ? ?REFERRING DIAG: M07.680S (ICD-10-CM) - Closed fracture of left ankle, initial encounter ? ?ONSET DATE: 10/22/2021 ? ?THERAPY DIAG:  ?Pain in left ankle and joints of left foot ? ?Muscle weakness (generalized) ? ?Difficulty in walking, not elsewhere classified ? ?PERTINENT HISTORY: HTN.  Injury 10/22/2021 with ED visit on 10/23/2021 ? ?PRECAUTIONS: None ? ?SUBJECTIVE:  Pt stated no complaints, just tired.   ? ?PAIN:  ? ?Are you having pain? No ?NPRS scale:  0/10  ?Pain location:  ?Pain orientation: Lt ankle/foot ?PAIN TYPE:  ?Pain description: rarely ?Aggravating factors: nothing specific  ?Relieving factors: n/a ? ?OBJECTIVE:   ?PATIENT SURVEYS:  ?12/09/2021: FOTO ankle update: 63% ?11/06/2021: FOTO Ankle intake: 8   predicted:  50 ?  ?COGNITION: ?11/06/2021: Overall cognitive status: Within functional limits for tasks assessed                  ?  ?PALPATION: ?11/06/2021: unable to palpate (cast on) ?  ?LE AROM/PROM: ?  ?A/PROM Right ?11/06/21 Left ?11/19/21 Left ?11/25/2021 ?Measured in seated knee 90 deg Left ?12/09/2021 ?Measured in seated knee 90 deg  ?Hip flexion        ?Hip extension        ?Hip abduction        ?Hip adduction        ?Hip internal rotation        ?Hip external rotation        ?Knee flexion        ?Knee extension        ?Ankle dorsiflexion    A: 4* AROM 6 AROM 12  ?Ankle plantarflexion   A: 29*  AROM 40 AROM 40  ?Ankle inversion   A: 11*  AROM 14 AROM 26  ?Ankle eversion   A: 1*  AROM 20 AROM 22  ? (Blank rows = not tested) ?  ?LE MMT: ?  ?MMT Right ?11/06/21 Left ?11/06/21 Left ?11/25/2021 Left ?12/09/2021 Left ?12/18/2021  ?  Hip flexion 5/5 4/5     ?Hip extension         ?Hip abduction         ?Hip adduction         ?Hip internal rotation         ?Hip external rotation         ?Knee flexion 5/5 4/5     ?Knee extension 5/5 4/5     ?Ankle dorsiflexion 5/5   5/5 5/5   ?Ankle plantarflexion     2+/5 2+/5 (unable to assess in WB) 2+/5 - unable to perform single leg heel raise  ?Ankle inversion 5/5   4/5 4/5   ?Ankle eversion 5/5   4/5 5/5   ? (Blank rows = not tested) ?  ?UE MMT: ?MMT Right ?11/06/21 Left ?11/06/21  ?shoulder flexion 4+/5 5/5  ?shoulder extension      ?shoulder abduction 4/5 5/5  ?shoulder adduction      ?shoulder internal rotation 5/5 5/5  ?shoulder external rotation 4/5 5/5  ?elbow flexion 5/5 5/5  ?elbow extension 5/5 5/5  ?       ?       ?       ?       ?  ?  ?LOWER EXTREMITY SPECIAL TESTS:  ?11/06/2021: none performed today ?  ?FUNCTIONAL TESTS:  ?11/06/2021: non performed today ?  ?GAIT: ?12/16/2021: Ambulation without boot c quad cane in Rt UE ? ?12/04/2021:  Ambulation household distances in boot c quad cane in Rt UE  c primarily step to gait pattern.  ? ?11/06/2021: Distance walked: 15 ft ?Assistive device utilized: FWW ?Level of assistance: CGA to Min A ?Comments: step to gait pattern c NWB on Lt leg ? ?TODAY'S TREATMENT: ?12/23/2021 ? ?Therex: Recumbent bike lvl 3 10 mins ?  Incline board stretch 30 sec x 3 bilateral ?   ?NeuroReed SLS c toe tap fwd/lateral/reverse light touch contralateral leg x 10 bilateral ?  Double leg fitter rocker board fwd/back light touch control focus x 20 ?  Tandem stance on foam 1 min x 2 bilateral ? ?TherActivity Step up and over on Lt leg x 10 4 inch step ?  Leg press double leg 2 x 15 75 lbs, single leg 2x15 31 lbs ? ?12/18/2021 ? ?Therex: Recumbent bike lvl 3 10 mins ?  Standing double leg heel raise 2 x 10 (hands on bar) ?  Incline board stretch 30 sec x 5 bilateral ?   ? ?NeuroReed SLS c toe tap fwd/lateral/reverse light touch contralateral leg x 10 bilateral ?  Double leg fitter rocker board fwd/back light touch control focus x 20 ? ?TherActivity Step up and over on Lt leg x 15 4 inch step ?  Leg press double leg x 15 75 lbs, single leg Lt x15 31 lbs ?   ? ?12/16/2021 ? ?Therex: Recumbent bike lvl 2 10 mins ?  Inversion, eversion band 2x15 each direction Lt ankle ?  Seated lvl 2 BAPS fwd/back 20x , cicles cw, ccw 20x each way Lt ankle ?  Seated Lt foot incline board stretch c knee bent 30 sec x 5 ?   ? ?NeuroReed Tandem stance 1 min x 1 bilateral ?  Lt leg balance c tennis ball rolls fwd/back 2 x 10 c Rt leg ? ?TherActivity Step up on Lt leg 2 x 10 4 inch step ?  Leg press double leg x 15 75 lbs, single leg Lt x15 31 lbs ?  Instruction and performance of flight of stairs c cues for Rt leg up first, Lt leg down first to improve ability.  ? ?12/09/2021 ? ?Therex: Nustel lvl 5 10 mins UE/LE c boot on Lt leg ?  Inversion, eversion band 2x15 each direction Lt ankle ?  Seated lvl 2 BAPS fwd/back 20x , cicles cw, ccw 20x each way Lt ankle ?  Seated Lt foot incline board stretch c knee bent 30 sec x  5 ?  ? ?TherActivity Functional activity including ramp ascending/descending x 2 each c CGA and verbal cues for sequencing for navigation.  Functional curb 6 inch c quad cane. ? ? ? ?PATIENT EDUCATION:  ?12/16/2021:  ?Education details: HEP progression ?Person educated: Patient ?Education method: Explanation, Demonstration, Verbal cues ?Education comprehension: verbalized understanding and returned demonstration ? ?  ?  ?HOME EXERCISE PROGRAM: ?Access Code: [email or web address] ?URL: [email or web address] ?Date: 12/16/2021 ?Prepared by: Scot Jun ? ?Exercises ?- Seated Straight Leg Heel Taps  - 1-2 x daily - 7 x weekly - 3 sets - 10 reps ?- Long Sitting Ankle Eversion with Resistance  - 2 x daily - 7 x weekly - 3 sets - 10 reps ?- Long Sitting Ankle Inversion with Resistance  - 2 x daily - 7 x weekly - 3 sets - 10 reps ?- Long Sitting Ankle Dorsiflexion with Anchored Resistance (Mirrored)  - 2 x daily - 7 x weekly - 3 sets - 10 reps ?- Standing Gastroc Stretch on Step  - 2 x daily - 7 x weekly - 1 sets - 5 reps - 30 sec hold ?- Single Leg Stance  - 1 x daily - 7 x weekly - 1 sets - 5 reps - 20-30 hold ?- Tandem Stance  - 1 x daily - 7 x weekly - 1 sets - 5 reps - 20-30 hold ?- Standing Heel Raise  - 1 x daily - 7 x weekly - 1-2 sets - 10 reps ? ?  ?  ?ASSESSMENT: ?  ?CLINICAL IMPRESSION: ?Approval for 12 visits given from worker's comp, today starting #1.  Fair control at best on compliant surface, requiring intermittent hand assist correction on bar during performances.  ? ?  ?REHAB POTENTIAL: Good ?  ?CLINICAL DECISION MAKING: Stable/uncomplicated ?  ?EVALUATION COMPLEXITY: Low ?  ?GOALS: ?Goals reviewed with patient? Yes ?  ?SHORT TERM GOALS: ?  ?STG Name Target Date Goal status  ?1 Patient will demonstrate independent use of home exercise program to maintain progress from in clinic treatments.  11/27/2021 MET ?Assessed 11/25/2021  ?2 Patient will demonstrate modified independent ambulation c LRAD community  distances (pending WB changes) 11/27/2021 Unable to reach goal prior to target date due to [facility] restrictions   ?3  Patient will demonstrate safe ascending/descending stairs c use of assistive device and rails to facil

## 2021-12-25 ENCOUNTER — Encounter: Payer: Self-pay | Admitting: Rehabilitative and Restorative Service Providers"

## 2021-12-25 ENCOUNTER — Other Ambulatory Visit: Payer: Self-pay

## 2021-12-25 ENCOUNTER — Ambulatory Visit (INDEPENDENT_AMBULATORY_CARE_PROVIDER_SITE_OTHER): Payer: No Typology Code available for payment source | Admitting: Rehabilitative and Restorative Service Providers"

## 2021-12-25 DIAGNOSIS — R262 Difficulty in walking, not elsewhere classified: Secondary | ICD-10-CM

## 2021-12-25 DIAGNOSIS — M6281 Muscle weakness (generalized): Secondary | ICD-10-CM

## 2021-12-25 DIAGNOSIS — M25572 Pain in left ankle and joints of left foot: Secondary | ICD-10-CM | POA: Diagnosis not present

## 2021-12-25 NOTE — Therapy (Signed)
?OUTPATIENT PHYSICAL THERAPY TREATMENT NOTE ?Patient Name: Christie Davis ?MRN: [id number] ?DOB:03-12-1946, 76 y.o., female ?Today's Date: 12/25/2021 ? ?PCP: Harlan Stains, MD ?REFERRING PROVIDER: Meredith Pel, MD ? ? PT End of Session - 12/25/21 1306   ? ? Visit Number 13   ? Number of Visits 24   ? Date for PT Re-Evaluation 02/10/22   ? Authorization Type WC approved 12 visits   ? Authorization - Visit Number 2   ? Authorization - Number of Visits 12   ? Progress Note Due on Visit 19   ? PT Start Time 1303   ? PT Stop Time 1342   ? PT Time Calculation (min) 39 min   ? Activity Tolerance Patient tolerated treatment well   ? Behavior During Therapy [facility] for tasks assessed/performed   ? ?  ?  ? ?  ? ? ? ? ? ? ? ? ?Past Medical History:  ?Diagnosis Date  ? Chronic cough   ? off and on-  ? Hypertension   ? Medical history non-contributory   ? Wears glasses   ? ?Past Surgical History:  ?Procedure Laterality Date  ? COLONOSCOPY    ? ORIF HUMERUS FRACTURE Right 12/06/2012  ? Procedure: OPEN REDUCTION INTERNAL FIXATION (ORIF) PROXIMAL HUMERUS FRACTURE;  Surgeon: Nita Sells, MD;  Location: [place];  Service: Orthopedics;  Laterality: Right;  ? RECTAL TUMOR BY PROCTOTOMY EXCISION  2007  ? TUBAL LIGATION    ? ?There are no problems to display for this patient. ? ? ?REFERRING DIAG: W09.811B (ICD-10-CM) - Closed fracture of left ankle, initial encounter ? ?ONSET DATE: 10/22/2021 ? ?THERAPY DIAG:  ?Pain in left ankle and joints of left foot ? ?Muscle weakness (generalized) ? ?Difficulty in walking, not elsewhere classified ? ?PERTINENT HISTORY: HTN.  Injury 10/22/2021 with ED visit on 10/23/2021 ? ?PRECAUTIONS: None ? ?SUBJECTIVE:  No complaints indicated upon arrival.  ? ?PAIN:  ? ?Are you having pain? No ?NPRS scale:  0/10  ?Pain location:  ?Pain orientation: Lt ankle/foot ?PAIN TYPE:  ?Pain description: rarely ?Aggravating factors:  ?Relieving factors: ? ?OBJECTIVE:  ?PATIENT SURVEYS:   ?12/09/2021: FOTO ankle update: 63% ?11/06/2021: FOTO Ankle intake: 8   predicted:  50 ?  ?COGNITION: ?11/06/2021: Overall cognitive status: Within functional limits for tasks assessed                  ?  ?PALPATION: ?11/06/2021: unable to palpate (cast on) ?  ?LE AROM/PROM: ?  ?A/PROM Right ?11/06/21 Left ?11/19/21 Left ?11/25/2021 ?Measured in seated knee 90 deg Left ?12/09/2021 ?Measured in seated knee 90 deg  ?Hip flexion        ?Hip extension        ?Hip abduction        ?Hip adduction        ?Hip internal rotation        ?Hip external rotation        ?Knee flexion        ?Knee extension        ?Ankle dorsiflexion    A: 4* AROM 6 AROM 12  ?Ankle plantarflexion   A: 29*  AROM 40 AROM 40  ?Ankle inversion   A: 11*  AROM 14 AROM 26  ?Ankle eversion   A: 1*  AROM 20 AROM 22  ? (Blank rows = not tested) ?  ?LE MMT: ?  ?MMT Right ?11/06/21 Left ?11/06/21 Left ?11/25/2021 Left ?12/09/2021 Left ?12/18/2021  ?Hip flexion 5/5 4/5     ?  Hip extension         ?Hip abduction         ?Hip adduction         ?Hip internal rotation         ?Hip external rotation         ?Knee flexion 5/5 4/5     ?Knee extension 5/5 4/5     ?Ankle dorsiflexion 5/5   5/5 5/5   ?Ankle plantarflexion     2+/5 2+/5 (unable to assess in WB) 2+/5 - unable to perform single leg heel raise  ?Ankle inversion 5/5   4/5 4/5   ?Ankle eversion 5/5   4/5 5/5   ? (Blank rows = not tested) ?  ?UE MMT: ?MMT Right ?11/06/21 Left ?11/06/21  ?shoulder flexion 4+/5 5/5  ?shoulder extension      ?shoulder abduction 4/5 5/5  ?shoulder adduction      ?shoulder internal rotation 5/5 5/5  ?shoulder external rotation 4/5 5/5  ?elbow flexion 5/5 5/5  ?elbow extension 5/5 5/5  ?       ?       ?       ?       ?  ?  ?LOWER EXTREMITY SPECIAL TESTS:  ?11/06/2021: none performed today ?  ?FUNCTIONAL TESTS:  ?11/06/2021: non performed today ?  ?GAIT: ?12/16/2021: Ambulation without boot c quad cane in Rt UE ? ?12/04/2021:  Ambulation household distances in boot c quad cane in Rt UE c primarily step  to gait pattern.  ? ?11/06/2021: Distance walked: 15 ft ?Assistive device utilized: FWW ?Level of assistance: CGA to Min A ?Comments: step to gait pattern c NWB on Lt leg ? ?TODAY'S TREATMENT: ?12/25/2021 ? ?Therex: Recumbent bike lvl 3 10 mins ?  Incline board stretch 1 min x 2 ?   ?NeuroReed SLS c toe tap fwd/lateral/reverse light touch contralateral leg x 10 bilateral ?  Double leg fitter rocker board fwd/back light touch control focus x 20 ?  Tandem stance on foam 1 min x 2 bilateral ?  Heel/Toe raise alternating on foam x 20  ? ?TherActivity Step up 2x 10 Lt leg leading, 6 inch step ?  Leg press double leg 2 x 15 75 lbs, single leg 2x15 37 lbs ? ? ?12/23/2021 ? ?Therex: Recumbent bike lvl 3 10 mins ?  Incline board stretch 30 sec x 3 bilateral ?   ?NeuroReed SLS c toe tap fwd/lateral/reverse light touch contralateral leg x 10 bilateral ?  Double leg fitter rocker board fwd/back light touch control focus x 20 ?  Tandem stance on foam 1 min x 2 bilateral ? ?TherActivity Step up and over on Lt leg x 10 4 inch step ?  Leg press double leg 2 x 15 75 lbs, single leg 2x15 31 lbs ? ?12/18/2021 ? ?Therex: Recumbent bike lvl 3 10 mins ?  Standing double leg heel raise 2 x 10 (hands on bar) ?  Incline board stretch 30 sec x 5 bilateral ?   ? ?NeuroReed SLS c toe tap fwd/lateral/reverse light touch contralateral leg x 10 bilateral ?  Double leg fitter rocker board fwd/back light touch control focus x 20 ? ?TherActivity Step up and over on Lt leg x 15 4 inch step ?  Leg press double leg x 15 75 lbs, single leg Lt x15 31 lbs ?   ? ?12/16/2021 ? ?Therex: Recumbent bike lvl 2 10 mins ?  Inversion, eversion band 2x15 each direction Lt  ankle ?  Seated lvl 2 BAPS fwd/back 20x , cicles cw, ccw 20x each way Lt ankle ?  Seated Lt foot incline board stretch c knee bent 30 sec x 5 ?   ? ?NeuroReed Tandem stance 1 min x 1 bilateral ?  Lt leg balance c tennis ball rolls fwd/back 2 x 10 c Rt leg ? ?TherActivity Step up on Lt leg 2 x 10 4 inch  step ?  Leg press double leg x 15 75 lbs, single leg Lt x15 31 lbs ?  Instruction and performance of flight of stairs c cues for Rt leg up first, Lt leg down first to improve ability.  ? ? ?PATIENT EDUCATION:  ?12/16/2021:  ?Education details: HEP progression ?Person educated: Patient ?Education method: Explanation, Demonstration, Verbal cues ?Education comprehension: verbalized understanding and returned demonstration ? ?  ?  ?HOME EXERCISE PROGRAM: ?Access Code: [email or web address] ?URL: [email or web address] ?Date: 12/16/2021 ?Prepared by: Scot Jun ? ?Exercises ?- Seated Straight Leg Heel Taps  - 1-2 x daily - 7 x weekly - 3 sets - 10 reps ?- Long Sitting Ankle Eversion with Resistance  - 2 x daily - 7 x weekly - 3 sets - 10 reps ?- Long Sitting Ankle Inversion with Resistance  - 2 x daily - 7 x weekly - 3 sets - 10 reps ?- Long Sitting Ankle Dorsiflexion with Anchored Resistance (Mirrored)  - 2 x daily - 7 x weekly - 3 sets - 10 reps ?- Standing Gastroc Stretch on Step  - 2 x daily - 7 x weekly - 1 sets - 5 reps - 30 sec hold ?- Single Leg Stance  - 1 x daily - 7 x weekly - 1 sets - 5 reps - 20-30 hold ?- Tandem Stance  - 1 x daily - 7 x weekly - 1 sets - 5 reps - 20-30 hold ?- Standing Heel Raise  - 1 x daily - 7 x weekly - 1-2 sets - 10 reps ? ?  ?  ?ASSESSMENT: ?  ?CLINICAL IMPRESSION: ?Pt to continue to benefit from skilled PT services to promote improve functional strength and balance.  Ambulation progressing in quality, able to perform independent in and out of clinic c good control.  ? ?  ?REHAB POTENTIAL: Good ?  ?CLINICAL DECISION MAKING: Stable/uncomplicated ?  ?EVALUATION COMPLEXITY: Low ?  ?GOALS: ?Goals reviewed with patient? Yes ?  ?SHORT TERM GOALS: ?  ?STG Name Target Date Goal status  ?1 Patient will demonstrate independent use of home exercise program to maintain progress from in clinic treatments.  11/27/2021 MET ?Assessed 11/25/2021  ?2 Patient will demonstrate modified independent  ambulation c LRAD community distances (pending WB changes) 11/27/2021 Unable to reach goal prior to target date due to [facility] restrictions   ?3  Patient will demonstrate safe ascending/descending stairs c use of assisti

## 2021-12-30 ENCOUNTER — Ambulatory Visit (INDEPENDENT_AMBULATORY_CARE_PROVIDER_SITE_OTHER): Payer: Medicare HMO | Admitting: Physical Therapy

## 2021-12-30 ENCOUNTER — Encounter: Payer: Self-pay | Admitting: Physical Therapy

## 2021-12-30 ENCOUNTER — Other Ambulatory Visit: Payer: Self-pay

## 2021-12-30 DIAGNOSIS — R262 Difficulty in walking, not elsewhere classified: Secondary | ICD-10-CM | POA: Diagnosis not present

## 2021-12-30 DIAGNOSIS — M25572 Pain in left ankle and joints of left foot: Secondary | ICD-10-CM | POA: Diagnosis not present

## 2021-12-30 DIAGNOSIS — M6281 Muscle weakness (generalized): Secondary | ICD-10-CM | POA: Diagnosis not present

## 2021-12-30 NOTE — Therapy (Signed)
?OUTPATIENT PHYSICAL THERAPY TREATMENT NOTE ?Patient Name: Christie Davis ?MRN: [id number] ?DOB:1946/05/26, 76 y.o., female ?Today's Date: 12/30/2021 ? ?PCP: Harlan Stains, MD ?REFERRING PROVIDER: Meredith Pel, MD ? ? PT End of Session - 12/30/21 1020   ? ? Visit Number 14   ? Number of Visits 24   ? Date for PT Re-Evaluation 02/10/22   ? Authorization Type WC approved 12 visits   ? Authorization - Visit Number 3   ? Authorization - Number of Visits 12   ? Progress Note Due on Visit 19   ? PT Start Time 1015   ? Activity Tolerance Patient tolerated treatment well   ? Behavior During Therapy [facility] for tasks assessed/performed   ? ?  ?  ? ?  ? ? ? ? ? ? ? ? ? ?Past Medical History:  ?Diagnosis Date  ? Chronic cough   ? off and on-  ? Hypertension   ? Medical history non-contributory   ? Wears glasses   ? ?Past Surgical History:  ?Procedure Laterality Date  ? COLONOSCOPY    ? ORIF HUMERUS FRACTURE Right 12/06/2012  ? Procedure: OPEN REDUCTION INTERNAL FIXATION (ORIF) PROXIMAL HUMERUS FRACTURE;  Surgeon: Nita Sells, MD;  Location: [place];  Service: Orthopedics;  Laterality: Right;  ? RECTAL TUMOR BY PROCTOTOMY EXCISION  2007  ? TUBAL LIGATION    ? ?There are no problems to display for this patient. ? ? ?REFERRING DIAG: Q11.941D (ICD-10-CM) - Closed fracture of left ankle, initial encounter ? ?ONSET DATE: 10/22/2021 ? ?THERAPY DIAG:  ?Pain in left ankle and joints of left foot ? ?Muscle weakness (generalized) ? ?Difficulty in walking, not elsewhere classified ? ?PERTINENT HISTORY: HTN.  Injury 10/22/2021 with ED visit on 10/23/2021 ? ?PRECAUTIONS: None ? ?SUBJECTIVE:  She has ache in lower medial leg if she has been standing. She tried no brace one day but her leg fatigued.  ? ?PAIN:  ? ?Are you having pain? No ?NPRS scale:  0/10  ?Pain location:  ?Pain orientation: Lt ankle/foot ?PAIN TYPE:  ?Pain description: rarely ?Aggravating factors:  ?Relieving factors: ? ?OBJECTIVE:  ?PATIENT  SURVEYS:  ?12/09/2021: FOTO ankle update: 63% ?11/06/2021: FOTO Ankle intake: 8   predicted:  50 ?  ?COGNITION: ?11/06/2021: Overall cognitive status: Within functional limits for tasks assessed                  ?  ?PALPATION: ?11/06/2021: unable to palpate (cast on) ?  ?LE AROM/PROM: ?  ?A/PROM Right ?11/06/21 Left ?11/19/21 Left ?11/25/2021 ?Measured in seated knee 90 deg Left ?12/09/2021 ?Measured in seated knee 90 deg  ?Hip flexion        ?Hip extension        ?Hip abduction        ?Hip adduction        ?Hip internal rotation        ?Hip external rotation        ?Knee flexion        ?Knee extension        ?Ankle dorsiflexion    A: 4* AROM 6 AROM 12  ?Ankle plantarflexion   A: 29*  AROM 40 AROM 40  ?Ankle inversion   A: 11*  AROM 14 AROM 26  ?Ankle eversion   A: 1*  AROM 20 AROM 22  ? (Blank rows = not tested) ?  ?LE MMT: ?  ?MMT Right ?11/06/21 Left ?11/06/21 Left ?11/25/2021 Left ?12/09/2021 Left ?12/18/2021  ?Hip flexion  5/5 4/5     ?Hip extension         ?Hip abduction         ?Hip adduction         ?Hip internal rotation         ?Hip external rotation         ?Knee flexion 5/5 4/5     ?Knee extension 5/5 4/5     ?Ankle dorsiflexion 5/5   5/5 5/5   ?Ankle plantarflexion     2+/5 2+/5 (unable to assess in WB) 2+/5 - unable to perform single leg heel raise  ?Ankle inversion 5/5   4/5 4/5   ?Ankle eversion 5/5   4/5 5/5   ? (Blank rows = not tested) ?  ?UE MMT: ?MMT Right ?11/06/21 Left ?11/06/21  ?shoulder flexion 4+/5 5/5  ?shoulder extension      ?shoulder abduction 4/5 5/5  ?shoulder adduction      ?shoulder internal rotation 5/5 5/5  ?shoulder external rotation 4/5 5/5  ?elbow flexion 5/5 5/5  ?elbow extension 5/5 5/5  ?       ?       ?       ?       ?  ?  ?LOWER EXTREMITY SPECIAL TESTS:  ?11/06/2021: none performed today ?  ?FUNCTIONAL TESTS:  ?11/06/2021: non performed today ?  ?GAIT: ?12/16/2021: Ambulation without boot c quad cane in Rt UE ? ?12/04/2021:  Ambulation household distances in boot c quad cane in Rt UE c  primarily step to gait pattern.  ? ?11/06/2021: Distance walked: 15 ft ?Assistive device utilized: FWW ?Level of assistance: CGA to Min A ?Comments: step to gait pattern c NWB on Lt leg ? ?TODAY'S TREATMENT: ?12/30/2021 ?Therex: Recumbent bike seat 4 lvl 3 10 mins ?  Gastroc stretch 1 min x 2 ?  Heel raises with heels off step for greater range with light BUE support 10 reps.  ?   ?NeuroReed SLS LLE on foam c toe tap 4 corners of square around stance LE light touch with UEs. x 10 bilateral ?  Double leg fitter rocker board fwd/back l& right/left light touch control focus x 10 reps 2 sets ?  Tandem stance on foam 1 min x 2 bilateral ?  Tandem walking on foam initiating on floor stepping onto beam trying to walk to end without touching. 8' X 6 ?  Side stepping on foam initiating on floor stepping onto beam trying to walk to end without touching. 8' X 6 ?  Heel/Toe raise alternating on foam x 10 reps straight motion, then worked diagonals 5 reps ea to build in functional inversion / eversion.  ? ?TherActivity Step up over & step down single UE support 2 x 10 Lt leg leading, 6 inch step ?  Leg press double leg 2 x 15 81 lbs, single leg 2x15 43 lbs ?PT educated on weaning down ankle brace with recommendation to start with 1-2 hours 2x/day then increase time. Pt verbalized understanding. ? ? ?12/25/2021 ? ?Therex: Recumbent bike lvl 3 10 mins ?  Incline board stretch 1 min x 2 ?   ?NeuroReed SLS c toe tap fwd/lateral/reverse light touch contralateral leg x 10 bilateral ?  Double leg fitter rocker board fwd/back light touch control focus x 20 ?  Tandem stance on foam 1 min x 2 bilateral ?  Heel/Toe raise alternating on foam x 20  ? ?TherActivity Step up 2x 10 Lt leg leading, 6  inch step ?  Leg press double leg 2 x 15 75 lbs, single leg 2x15 37 lbs ? ? ?12/23/2021 ? ?Therex: Recumbent bike lvl 3 10 mins ?  Incline board stretch 30 sec x 3 bilateral ?   ?NeuroReed SLS c toe tap fwd/lateral/reverse light touch contralateral leg x  10 bilateral ?  Double leg fitter rocker board fwd/back light touch control focus x 20 ?  Tandem stance on foam 1 min x 2 bilateral ? ?TherActivity Step up and over on Lt leg x 10 4 inch step ?  Leg press double leg 2 x 15 75 lbs, single leg 2x15 31 lbs ? ?12/18/2021 ? ?Therex: Recumbent bike lvl 3 10 mins ?  Standing double leg heel raise 2 x 10 (hands on bar) ?  Incline board stretch 30 sec x 5 bilateral ?   ? ?NeuroReed SLS c toe tap fwd/lateral/reverse light touch contralateral leg x 10 bilateral ?  Double leg fitter rocker board fwd/back light touch control focus x 20 ? ?TherActivity Step up and over on Lt leg x 15 4 inch step ?  Leg press double leg x 15 75 lbs, single leg Lt x15 31 lbs ?    ? ? ?PATIENT EDUCATION:  ?12/16/2021:  ?Education details: HEP progression ?Person educated: Patient ?Education method: Explanation, Demonstration, Verbal cues ?Education comprehension: verbalized understanding and returned demonstration ? ?  ?  ?HOME EXERCISE PROGRAM: ?Access Code: [email or web address] ?URL: [email or web address] ?Date: 12/16/2021 ?Prepared by: Scot Jun ? ?Exercises ?- Seated Straight Leg Heel Taps  - 1-2 x daily - 7 x weekly - 3 sets - 10 reps ?- Long Sitting Ankle Eversion with Resistance  - 2 x daily - 7 x weekly - 3 sets - 10 reps ?- Long Sitting Ankle Inversion with Resistance  - 2 x daily - 7 x weekly - 3 sets - 10 reps ?- Long Sitting Ankle Dorsiflexion with Anchored Resistance (Mirrored)  - 2 x daily - 7 x weekly - 3 sets - 10 reps ?- Standing Gastroc Stretch on Step  - 2 x daily - 7 x weekly - 1 sets - 5 reps - 30 sec hold ?- Single Leg Stance  - 1 x daily - 7 x weekly - 1 sets - 5 reps - 20-30 hold ?- Tandem Stance  - 1 x daily - 7 x weekly - 1 sets - 5 reps - 20-30 hold ?- Standing Heel Raise  - 1 x daily - 7 x weekly - 1-2 sets - 10 reps ? ?  ?  ?ASSESSMENT: ?  ?CLINICAL IMPRESSION: ?Patient tolerated progression of functional exercises and balance activities. She found them challenging  & PT noted muscle fatigue near end of ea exercise but quick recovery when transitioning to different exercise.  ? ?  ?REHAB POTENTIAL: Good ?  ?CLINICAL DECISION MAKING: Stable/uncomplicated ?  ?EVALUATION CO

## 2021-12-31 ENCOUNTER — Encounter: Payer: Medicare HMO | Admitting: Physical Therapy

## 2022-01-01 ENCOUNTER — Ambulatory Visit (INDEPENDENT_AMBULATORY_CARE_PROVIDER_SITE_OTHER): Payer: Medicare HMO | Admitting: Physical Therapy

## 2022-01-01 ENCOUNTER — Other Ambulatory Visit: Payer: Self-pay

## 2022-01-01 ENCOUNTER — Encounter: Payer: Self-pay | Admitting: Physical Therapy

## 2022-01-01 DIAGNOSIS — M25572 Pain in left ankle and joints of left foot: Secondary | ICD-10-CM | POA: Diagnosis not present

## 2022-01-01 DIAGNOSIS — R262 Difficulty in walking, not elsewhere classified: Secondary | ICD-10-CM

## 2022-01-01 DIAGNOSIS — M6281 Muscle weakness (generalized): Secondary | ICD-10-CM

## 2022-01-01 NOTE — Therapy (Signed)
?OUTPATIENT PHYSICAL THERAPY TREATMENT NOTE ?Patient Name: Christie Davis ?MRN: [id number] ?DOB:1945/12/24, 76 y.o., female ?Today's Date: 01/01/2022 ? ?PCP: Harlan Stains, MD ?REFERRING PROVIDER: Meredith Pel, MD ? ? PT End of Session - 01/01/22 1433   ? ? Visit Number 15   ? Number of Visits 24   ? Date for PT Re-Evaluation 02/10/22   ? Authorization Type WC approved 12 visits   ? Authorization - Visit Number 4   ? Authorization - Number of Visits 12   ? Progress Note Due on Visit 19   ? PT Start Time 1430   ? PT Stop Time 1515   ? PT Time Calculation (min) 45 min   ? Activity Tolerance Patient tolerated treatment well   ? Behavior During Therapy [facility] for tasks assessed/performed   ? ?  ?  ? ?  ? ? ? ? ? ? ? ? ? ? ?Past Medical History:  ?Diagnosis Date  ? Chronic cough   ? off and on-  ? Hypertension   ? Medical history non-contributory   ? Wears glasses   ? ?Past Surgical History:  ?Procedure Laterality Date  ? COLONOSCOPY    ? ORIF HUMERUS FRACTURE Right 12/06/2012  ? Procedure: OPEN REDUCTION INTERNAL FIXATION (ORIF) PROXIMAL HUMERUS FRACTURE;  Surgeon: Nita Sells, MD;  Location: [place];  Service: Orthopedics;  Laterality: Right;  ? RECTAL TUMOR BY PROCTOTOMY EXCISION  2007  ? TUBAL LIGATION    ? ?There are no problems to display for this patient. ? ? ?REFERRING DIAG: K80.034J (ICD-10-CM) - Closed fracture of left ankle, initial encounter ? ?ONSET DATE: 10/22/2021 ? ?THERAPY DIAG:  ?Pain in left ankle and joints of left foot ? ?Muscle weakness (generalized) ? ?Difficulty in walking, not elsewhere classified ? ?PERTINENT HISTORY: HTN.  Injury 10/22/2021 with ED visit on 10/23/2021 ? ?PRECAUTIONS: None ? ?SUBJECTIVE:  She tried no brace for 4hrs 1x yesterday and noted fatigue with some darkness. ? ?PAIN:  ? ?Are you having pain? No ?NPRS scale:  0/10  ?Pain location:  ?Pain orientation: Lt ankle/foot ?PAIN TYPE:  ?Pain description: rarely ?Aggravating factors:  ?Relieving  factors: ? ?OBJECTIVE:  ?PATIENT SURVEYS:  ?12/09/2021: FOTO ankle update: 63% ?11/06/2021: FOTO Ankle intake: 8   predicted:  50 ?  ?COGNITION: ?11/06/2021: Overall cognitive status: Within functional limits for tasks assessed                  ?  ?PALPATION: ?11/06/2021: unable to palpate (cast on) ?  ?LE AROM/PROM: ?  ?A/PROM Right ?11/06/21 Left ?11/19/21 Left ?11/25/2021 ?Measured in seated knee 90 deg Left ?12/09/2021 ?Measured in seated knee 90 deg  ?Hip flexion        ?Hip extension        ?Hip abduction        ?Hip adduction        ?Hip internal rotation        ?Hip external rotation        ?Knee flexion        ?Knee extension        ?Ankle dorsiflexion    A: 4* AROM 6 AROM 12  ?Ankle plantarflexion   A: 29*  AROM 40 AROM 40  ?Ankle inversion   A: 11*  AROM 14 AROM 26  ?Ankle eversion   A: 1*  AROM 20 AROM 22  ? (Blank rows = not tested) ?  ?LE MMT: ?  ?MMT Right ?11/06/21 Left ?11/06/21 Left ?  11/25/2021 Left ?12/09/2021 Left ?12/18/2021  ?Hip flexion 5/5 4/5     ?Hip extension         ?Hip abduction         ?Hip adduction         ?Hip internal rotation         ?Hip external rotation         ?Knee flexion 5/5 4/5     ?Knee extension 5/5 4/5     ?Ankle dorsiflexion 5/5   5/5 5/5   ?Ankle plantarflexion     2+/5 2+/5 (unable to assess in WB) 2+/5 - unable to perform single leg heel raise  ?Ankle inversion 5/5   4/5 4/5   ?Ankle eversion 5/5   4/5 5/5   ? (Blank rows = not tested) ?  ?UE MMT: ?MMT Right ?11/06/21 Left ?11/06/21  ?shoulder flexion 4+/5 5/5  ?shoulder extension      ?shoulder abduction 4/5 5/5  ?shoulder adduction      ?shoulder internal rotation 5/5 5/5  ?shoulder external rotation 4/5 5/5  ?elbow flexion 5/5 5/5  ?elbow extension 5/5 5/5  ?       ?       ?       ?       ?  ?  ?LOWER EXTREMITY SPECIAL TESTS:  ?11/06/2021: none performed today ?  ?FUNCTIONAL TESTS:  ?11/06/2021: non performed today ?  ?GAIT: ?12/16/2021: Ambulation without boot c quad cane in Rt UE ? ?12/04/2021:  Ambulation household distances in  boot c quad cane in Rt UE c primarily step to gait pattern.  ? ?11/06/2021: Distance walked: 15 ft ?Assistive device utilized: FWW ?Level of assistance: CGA to Min A ?Comments: step to gait pattern c NWB on Lt leg ? ?TODAY'S TREATMENT: ?01/01/2022 ?Therex: Recumbent bike seat 4 lvl 3 10 mins ?  Gastroc stretch 1 min x 2 on incline board runner's step ?  Heel raises off incline board in straddle step with light BUE support 10 reps.  ?   ?NeuroReed SLS LLE on foam c toe tap 4 corners of square around stance LE light touch with UEs. x 10 bilateral ?  Double leg fitter rocker board fwd/back l& right/left light touch control focus x 10 reps 2 sets ?  Tandem stance on foam 1 min bilateral intermittent touch ?  Tandem walking on foam initiating on floor stepping onto beam trying to walk to end without touching. 8' X 6 ?  Side stepping on foam initiating on floor stepping onto beam trying to walk to end without touching. 8' X 6 ?  Heel/Toe raise alternating on foam x 10 reps straight motion, then worked diagonals 5 reps ea to build in functional inversion / eversion.  ? ?TherActivity Step up over & step down single UE support 20 Lt leg leading, 6 inch step (4" with foam compliant surface on top) ?  Leg press double leg 2 x 15 87 lbs, single leg 2x15 43 lbs ? ?Self-Care: PT reviewed on weaning down ankle brace with recommendation to start with 2 hours 2x/day then increase time. Twice per day enable her leg to not fatigue as much and progress total time. PT plans to increase to 3hrs 2x/day once limited fatigue, swelling & pain from no support. Pt verbalized understanding. ? ?12/30/2021 ?Therex: Recumbent bike seat 4 lvl 3 10 mins ?  Gastroc stretch 1 min x 2 ?  Heel raises with heels off step for greater range with  light BUE support 10 reps.  ?   ?NeuroReed SLS LLE on foam c toe tap 4 corners of square around stance LE light touch with UEs. x 10 bilateral ?  Double leg fitter rocker board fwd/back l& right/left light touch  control focus x 10 reps 2 sets ?  Tandem stance on foam 1 min x 2 bilateral ?  Tandem walking on foam initiating on floor stepping onto beam trying to walk to end without touching. 8' X 6 ?  Side stepping on foam initiating on floor stepping onto beam trying to walk to end without touching. 8' X 6 ?  Heel/Toe raise alternating on foam x 10 reps straight motion, then worked diagonals 5 reps ea to build in functional inversion / eversion.  ? ?TherActivity Step up over & step down single UE support 2 x 10 Lt leg leading, 6 inch step ?  Leg press double leg 2 x 15 81 lbs, single leg 2x15 43 lbs ?PT educated on weaning down ankle brace with recommendation to start with 1-2 hours 2x/day then increase time. Pt verbalized understanding. ? ? ?12/25/2021 ? ?Therex: Recumbent bike lvl 3 10 mins ?  Incline board stretch 1 min x 2 ?   ?NeuroReed SLS c toe tap fwd/lateral/reverse light touch contralateral leg x 10 bilateral ?  Double leg fitter rocker board fwd/back light touch control focus x 20 ?  Tandem stance on foam 1 min x 2 bilateral ?  Heel/Toe raise alternating on foam x 20  ? ?TherActivity Step up 2x 10 Lt leg leading, 6 inch step ?  Leg press double leg 2 x 15 75 lbs, single leg 2x15 37 lbs ? ? ?PATIENT EDUCATION:  ?12/16/2021:  ?Education details: HEP progression ?Person educated: Patient ?Education method: Explanation, Demonstration, Verbal cues ?Education comprehension: verbalized understanding and returned demonstration ? ?  ?  ?HOME EXERCISE PROGRAM: ?Access Code: [email or web address] ?URL: [email or web address] ?Date: 12/16/2021 ?Prepared by: Scot Jun ? ?Exercises ?- Seated Straight Leg Heel Taps  - 1-2 x daily - 7 x weekly - 3 sets - 10 reps ?- Long Sitting Ankle Eversion with Resistance  - 2 x daily - 7 x weekly - 3 sets - 10 reps ?- Long Sitting Ankle Inversion with Resistance  - 2 x daily - 7 x weekly - 3 sets - 10 reps ?- Long Sitting Ankle Dorsiflexion with Anchored Resistance (Mirrored)  - 2 x daily -  7 x weekly - 3 sets - 10 reps ?- Standing Gastroc Stretch on Step  - 2 x daily - 7 x weekly - 1 sets - 5 reps - 30 sec hold ?- Single Leg Stance  - 1 x daily - 7 x weekly - 1 sets - 5 reps - 20-30 hold ?Edd Arbour

## 2022-01-02 ENCOUNTER — Encounter: Payer: Medicare HMO | Admitting: Physical Therapy

## 2022-01-06 ENCOUNTER — Encounter: Payer: Self-pay | Admitting: Rehabilitative and Restorative Service Providers"

## 2022-01-06 ENCOUNTER — Ambulatory Visit (INDEPENDENT_AMBULATORY_CARE_PROVIDER_SITE_OTHER): Payer: No Typology Code available for payment source | Admitting: Rehabilitative and Restorative Service Providers"

## 2022-01-06 ENCOUNTER — Other Ambulatory Visit: Payer: Self-pay

## 2022-01-06 DIAGNOSIS — M25572 Pain in left ankle and joints of left foot: Secondary | ICD-10-CM

## 2022-01-06 DIAGNOSIS — M6281 Muscle weakness (generalized): Secondary | ICD-10-CM

## 2022-01-06 DIAGNOSIS — R262 Difficulty in walking, not elsewhere classified: Secondary | ICD-10-CM

## 2022-01-06 NOTE — Therapy (Signed)
?OUTPATIENT PHYSICAL THERAPY TREATMENT NOTE ?Patient Name: Christie Davis ?MRN: [id number] ?DOB:06-02-1946, 76 y.o., female ?Today's Date: 01/06/2022 ? ?PCP: Harlan Stains, MD ?REFERRING PROVIDER: Meredith Pel, MD ? ? PT End of Session - 01/06/22 1524   ? ? Visit Number 16   ? Number of Visits 24   ? Date for PT Re-Evaluation 02/10/22   ? Authorization Type WC approved 12 visits   ? Authorization - Visit Number 5   ? Authorization - Number of Visits 12   ? Progress Note Due on Visit 19   ? PT Start Time 1510   ? PT Stop Time 1550   ? PT Time Calculation (min) 40 min   ? Activity Tolerance Patient tolerated treatment well   ? Behavior During Therapy [facility] for tasks assessed/performed   ? ?  ?  ? ?  ? ? ? ? ? ? ? ? ? ? ? ?Past Medical History:  ?Diagnosis Date  ? Chronic cough   ? off and on-  ? Hypertension   ? Medical history non-contributory   ? Wears glasses   ? ?Past Surgical History:  ?Procedure Laterality Date  ? COLONOSCOPY    ? ORIF HUMERUS FRACTURE Right 12/06/2012  ? Procedure: OPEN REDUCTION INTERNAL FIXATION (ORIF) PROXIMAL HUMERUS FRACTURE;  Surgeon: Nita Sells, MD;  Location: [place];  Service: Orthopedics;  Laterality: Right;  ? RECTAL TUMOR BY PROCTOTOMY EXCISION  2007  ? TUBAL LIGATION    ? ?There are no problems to display for this patient. ? ? ?REFERRING DIAG: D92.426S (ICD-10-CM) - Closed fracture of left ankle, initial encounter ? ?ONSET DATE: 10/22/2021 ? ?THERAPY DIAG:  ?Pain in left ankle and joints of left foot ? ?Muscle weakness (generalized) ? ?Difficulty in walking, not elsewhere classified ? ?PERTINENT HISTORY: HTN.  Injury 10/22/2021 with ED visit on 10/23/2021 ? ?PRECAUTIONS: None ? ?SUBJECTIVE:  Pt indicated no pain at rest, sometimes at night.  Pt indicated overall improvement around 70% at this time.  Pt indicated she does get tired sooner.    ? ?PAIN:  ? ?Are you having pain? No ?NPRS scale:  pain at worst 5/10 at night ?Pain location:  ?Pain  orientation: Lt ankle/foot ?PAIN TYPE:  ?Pain description: rarely ?Aggravating factors: at night time when trying to rest ?Relieving factors: ? ?01/06/2022:  Patient Specific Functional Scale ?  -stairs: 7/10 (10 being no problem) ?  - walking 5/10 ? ?OBJECTIVE:  ?PATIENT SURVEYS:  ?12/09/2021: FOTO ankle update: 63% ?11/06/2021: FOTO Ankle intake: 8   predicted:  50 ?  ?COGNITION: ?11/06/2021: Overall cognitive status: Within functional limits for tasks assessed                  ?  ?PALPATION: ?11/06/2021: unable to palpate (cast on) ?  ?LE AROM/PROM: ?  ?A/PROM Right ?11/06/21 Left ?11/19/21 Left ?11/25/2021 ?Measured in seated knee 90 deg Left ?12/09/2021 ?Measured in seated knee 90 deg  ?Hip flexion        ?Hip extension        ?Hip abduction        ?Hip adduction        ?Hip internal rotation        ?Hip external rotation        ?Knee flexion        ?Knee extension        ?Ankle dorsiflexion    A: 4* AROM 6 AROM 12  ?Ankle plantarflexion   A: 29*  AROM 40 AROM 40  ?Ankle inversion   A: 11*  AROM 14 AROM 26  ?Ankle eversion   A: 1*  AROM 20 AROM 22  ? (Blank rows = not tested) ?  ?LE MMT: ?  ?MMT Right ?11/06/21 Left ?11/06/21 Left ?11/25/2021 Left ?12/09/2021 Left ?12/18/2021 Left ?01/06/2022  ?Hip flexion 5/5 4/5      ?Hip extension          ?Hip abduction          ?Hip adduction          ?Hip internal rotation          ?Hip external rotation          ?Knee flexion 5/5 4/5      ?Knee extension 5/5 4/5      ?Ankle dorsiflexion 5/5   5/5 5/5    ?Ankle plantarflexion     2+/5 2+/5 (unable to assess in WB) 2+/5 - unable to perform single leg heel raise 3/5 - able to clear for 1 rep  ?Ankle inversion 5/5   4/5 4/5    ?Ankle eversion 5/5   4/5 5/5    ? (Blank rows = not tested) ?  ?UE MMT: ?MMT Right ?11/06/21 Left ?11/06/21  ?shoulder flexion 4+/5 5/5  ?shoulder extension      ?shoulder abduction 4/5 5/5  ?shoulder adduction      ?shoulder internal rotation 5/5 5/5  ?shoulder external rotation 4/5 5/5  ?elbow flexion 5/5 5/5  ?elbow  extension 5/5 5/5  ?       ?       ?       ?       ?  ?  ?LOWER EXTREMITY SPECIAL TESTS:  ?11/06/2021: none performed today ?  ?FUNCTIONAL TESTS:  ?11/06/2021: non performed today ?  ?GAIT: ?12/16/2021: Ambulation without boot c quad cane in Rt UE ? ?12/04/2021:  Ambulation household distances in boot c quad cane in Rt UE c primarily step to gait pattern.  ? ?11/06/2021: Distance walked: 15 ft ?Assistive device utilized: FWW ?Level of assistance: CGA to Min A ?Comments: step to gait pattern c NWB on Lt leg ? ?TODAY'S TREATMENT: ?01/06/2022 ? ?Therex: UBE LE only bike lvl 4.0 6 mins, seat 9 ?  Double leg heel raise up, majority to Lt on lowering 2 x 10 c handrail assist for UE.  ?  Incline board stretch 1 min x 3 ?   ?NeuroReed SLS c toe tap fwd/lateral/reverse light touch contralateral leg x 10 bilateral on airex foam ?  Tandem ambulation on foam in //bars 8 ft x 10 fwd/rev occasional HHA on bars required ?   ? ?TherActivity Step up 2x 10 Lt leg leading, 6 inch step ?  Step up/over/down on Lt leg 6 inch step, on 4 inch step c 2 inch foam on top (total 6 inch) no UE assist x 10 each ?  Flight of stairs reciprocal gait pattern with one handrail assist up/down full flight in building x 1 ? ?01/01/2022 ?Therex: Recumbent bike seat 4 lvl 3 10 mins ?  Gastroc stretch 1 min x 2 on incline board runner's step ?  Heel raises off incline board in straddle step with light BUE support 10 reps.  ?   ?NeuroReed SLS LLE on foam c toe tap 4 corners of square around stance LE light touch with UEs. x 10 bilateral ?  Double leg fitter rocker board fwd/back l& right/left light touch control focus  x 10 reps 2 sets ?  Tandem stance on foam 1 min bilateral intermittent touch ?  Tandem walking on foam initiating on floor stepping onto beam trying to walk to end without touching. 8' X 6 ?  Side stepping on foam initiating on floor stepping onto beam trying to walk to end without touching. 8' X 6 ?  Heel/Toe raise alternating on foam x 10 reps  straight motion, then worked diagonals 5 reps ea to build in functional inversion / eversion.  ? ?TherActivity Step up over & step down single UE support 20 Lt leg leading, 6 inch step (4" with foam compliant surface on top) ?  Leg press double leg 2 x 15 87 lbs, single leg 2x15 43 lbs ? ?Self-Care: PT reviewed on weaning down ankle brace with recommendation to start with 2 hours 2x/day then increase time. Twice per day enable her leg to not fatigue as much and progress total time. PT plans to increase to 3hrs 2x/day once limited fatigue, swelling & pain from no support. Pt verbalized understanding. ? ?12/30/2021 ?Therex: Recumbent bike seat 4 lvl 3 10 mins ?  Gastroc stretch 1 min x 2 ?  Heel raises with heels off step for greater range with light BUE support 10 reps.  ?   ?NeuroReed SLS LLE on foam c toe tap 4 corners of square around stance LE light touch with UEs. x 10 bilateral ?  Double leg fitter rocker board fwd/back l& right/left light touch control focus x 10 reps 2 sets ?  Tandem stance on foam 1 min x 2 bilateral ?  Tandem walking on foam initiating on floor stepping onto beam trying to walk to end without touching. 8' X 6 ?  Side stepping on foam initiating on floor stepping onto beam trying to walk to end without touching. 8' X 6 ?  Heel/Toe raise alternating on foam x 10 reps straight motion, then worked diagonals 5 reps ea to build in functional inversion / eversion.  ? ?TherActivity Step up over & step down single UE support 2 x 10 Lt leg leading, 6 inch step ?  Leg press double leg 2 x 15 81 lbs, single leg 2x15 43 lbs ?PT educated on weaning down ankle brace with recommendation to start with 1-2 hours 2x/day then increase time. Pt verbalized understanding. ? ? ?12/25/2021 ? ?Therex: Recumbent bike lvl 3 10 mins ?  Incline board stretch 1 min x 2 ?   ?NeuroReed SLS c toe tap fwd/lateral/reverse light touch contralateral leg x 10 bilateral ?  Double leg fitter rocker board fwd/back light touch control  focus x 20 ?  Tandem stance on foam 1 min x 2 bilateral ?  Heel/Toe raise alternating on foam x 20  ? ?TherActivity Step up 2x 10 Lt leg leading, 6 inch step ?  Leg press double leg 2 x 15 75 lbs, single leg

## 2022-01-08 ENCOUNTER — Encounter: Payer: Self-pay | Admitting: Rehabilitative and Restorative Service Providers"

## 2022-01-08 ENCOUNTER — Other Ambulatory Visit: Payer: Self-pay

## 2022-01-08 ENCOUNTER — Ambulatory Visit (INDEPENDENT_AMBULATORY_CARE_PROVIDER_SITE_OTHER): Payer: No Typology Code available for payment source | Admitting: Rehabilitative and Restorative Service Providers"

## 2022-01-08 DIAGNOSIS — R262 Difficulty in walking, not elsewhere classified: Secondary | ICD-10-CM | POA: Diagnosis not present

## 2022-01-08 DIAGNOSIS — M25572 Pain in left ankle and joints of left foot: Secondary | ICD-10-CM

## 2022-01-08 DIAGNOSIS — M6281 Muscle weakness (generalized): Secondary | ICD-10-CM | POA: Diagnosis not present

## 2022-01-08 NOTE — Therapy (Signed)
?OUTPATIENT PHYSICAL THERAPY TREATMENT NOTE ?Patient Name: Christie Davis ?MRN: [id number] ?DOB:Jul 04, 1946, 75 y.o., female ?Today's Date: 01/08/2022 ? ?PCP: Harlan Stains, MD ?REFERRING PROVIDER: Meredith Pel, MD ? ? PT End of Session - 01/08/22 1428   ? ? Visit Number 17   ? Number of Visits 24   ? Date for PT Re-Evaluation 02/10/22   ? Authorization Type WC approved 12 visits   ? Authorization - Visit Number 6   ? Authorization - Number of Visits 12   ? Progress Note Due on Visit 19   ? PT Start Time 1424   ? PT Stop Time 1503   ? PT Time Calculation (min) 39 min   ? Activity Tolerance Patient tolerated treatment well   ? Behavior During Therapy [facility] for tasks assessed/performed   ? ?  ?  ? ?  ? ? ? ? ? ? ? ? ? ? ? ? ?Past Medical History:  ?Diagnosis Date  ? Chronic cough   ? off and on-  ? Hypertension   ? Medical history non-contributory   ? Wears glasses   ? ?Past Surgical History:  ?Procedure Laterality Date  ? COLONOSCOPY    ? ORIF HUMERUS FRACTURE Right 12/06/2012  ? Procedure: OPEN REDUCTION INTERNAL FIXATION (ORIF) PROXIMAL HUMERUS FRACTURE;  Surgeon: Nita Sells, MD;  Location: [place];  Service: Orthopedics;  Laterality: Right;  ? RECTAL TUMOR BY PROCTOTOMY EXCISION  2007  ? TUBAL LIGATION    ? ?There are no problems to display for this patient. ? ? ?REFERRING DIAG: W11.914N (ICD-10-CM) - Closed fracture of left ankle, initial encounter ? ?ONSET DATE: 10/22/2021 ? ?THERAPY DIAG:  ?Pain in left ankle and joints of left foot ? ?Muscle weakness (generalized) ? ?Difficulty in walking, not elsewhere classified ? ?PERTINENT HISTORY: HTN.  Injury 10/22/2021 with ED visit on 10/23/2021 ? ?PRECAUTIONS: None ? ?SUBJECTIVE:  Pt indicated no pain at rest, sometimes at night.  Pt indicated overall improvement around 70% at this time.  Pt indicated she does get tired sooner.    ? ?PAIN:  ? ?Are you having pain? No ?NPRS scale:  pain at worst 5/10 at night ?Pain location:  ?Pain  orientation: Lt ankle/foot ?PAIN TYPE:  ?Pain description: rarely ?Aggravating factors: at night time when trying to rest ?Relieving factors: ? ?01/06/2022:  Patient Specific Functional Scale ?  -stairs: 7/10 (10 being no problem) ?  - walking 5/10 ? ?OBJECTIVE:  ?PATIENT SURVEYS:  ?12/09/2021: FOTO ankle update: 63% ?11/06/2021: FOTO Ankle intake: 8   predicted:  50 ?  ?COGNITION: ?11/06/2021: Overall cognitive status: Within functional limits for tasks assessed                  ?  ?PALPATION: ?11/06/2021: unable to palpate (cast on) ?  ?LE AROM/PROM: ?  ?A/PROM Right ?11/06/21 Left ?11/19/21 Left ?11/25/2021 ?Measured in seated knee 90 deg Left ?12/09/2021 ?Measured in seated knee 90 deg  ?Hip flexion        ?Hip extension        ?Hip abduction        ?Hip adduction        ?Hip internal rotation        ?Hip external rotation        ?Knee flexion        ?Knee extension        ?Ankle dorsiflexion    A: 4* AROM 6 AROM 12  ?Ankle plantarflexion   A: 29*  AROM 40 AROM 40  ?Ankle inversion   A: 11*  AROM 14 AROM 26  ?Ankle eversion   A: 1*  AROM 20 AROM 22  ? (Blank rows = not tested) ?  ?LE MMT: ?  ?MMT Right ?11/06/21 Left ?11/06/21 Left ?11/25/2021 Left ?12/09/2021 Left ?12/18/2021 Left ?01/06/2022  ?Hip flexion 5/5 4/5      ?Hip extension          ?Hip abduction          ?Hip adduction          ?Hip internal rotation          ?Hip external rotation          ?Knee flexion 5/5 4/5      ?Knee extension 5/5 4/5      ?Ankle dorsiflexion 5/5   5/5 5/5    ?Ankle plantarflexion     2+/5 2+/5 (unable to assess in WB) 2+/5 - unable to perform single leg heel raise 3/5 - able to clear for 1 rep  ?Ankle inversion 5/5   4/5 4/5    ?Ankle eversion 5/5   4/5 5/5    ? (Blank rows = not tested) ?  ?UE MMT: ?MMT Right ?11/06/21 Left ?11/06/21  ?shoulder flexion 4+/5 5/5  ?shoulder extension      ?shoulder abduction 4/5 5/5  ?shoulder adduction      ?shoulder internal rotation 5/5 5/5  ?shoulder external rotation 4/5 5/5  ?elbow flexion 5/5 5/5  ?elbow  extension 5/5 5/5  ?       ?       ?       ?       ?  ?  ?LOWER EXTREMITY SPECIAL TESTS:  ?11/06/2021: none performed today ?  ?FUNCTIONAL TESTS:  ?11/06/2021: non performed today ?  ?GAIT: ?12/16/2021: Ambulation without boot c quad cane in Rt UE ? ?12/04/2021:  Ambulation household distances in boot c quad cane in Rt UE c primarily step to gait pattern.  ? ?11/06/2021: Distance walked: 15 ft ?Assistive device utilized: FWW ?Level of assistance: CGA to Min A ?Comments: step to gait pattern c NWB on Lt leg ? ?TODAY'S TREATMENT: ?01/08/2022 ? ?Therex: Recumbent bike lvl 3 10 mins seat 5 ?  Calf raise on leg press Lt leg 3 x 10 50 lbs  ?  Incline board stretch 1 min x 3 ?   ?NeuroReed SLS c toe tap on cones (anterior/medial, anterior, anterior/lateral) x 10 each , performed bilateral ?  Tandem ambulation on foam in //bars 8 ft x 6 fwd/rev each occasional HHA on bars required ?  Fitter rocker board fwd/back light taps 30x each way ?   ? ?TherActivity Step up/over/down on Lt leg 6 inch step no UE assist x 10 each ?  Single leg press Lt 50 lbs 2 x 15, Rt 50 lbs 2 x 15 ? ?01/06/2022 ? ?Therex: UBE LE only bike lvl 4.0 6 mins, seat 9 ?  Double leg heel raise up, majority to Lt on lowering 2 x 10 c handrail assist for UE.  ?  Incline board stretch 1 min x 3 ?   ?NeuroReed SLS c toe tap fwd/lateral/reverse light touch contralateral leg x 10 bilateral on airex foam ?  Tandem ambulation on foam in //bars 8 ft x 10 fwd/rev occasional HHA on bars required ?   ? ?TherActivity Step up 2x 10 Lt leg leading, 6 inch step ?  Step up/over/down on Lt leg  6 inch step, on 4 inch step c 2 inch foam on top (total 6 inch) no UE assist x 10 each ?  Flight of stairs reciprocal gait pattern with one handrail assist up/down full flight in building x 1 ? ?01/01/2022 ?Therex: Recumbent bike seat 4 lvl 3 10 mins ?  Gastroc stretch 1 min x 2 on incline board runner's step ?  Heel raises off incline board in straddle step with light BUE support 10 reps.   ?   ?NeuroReed SLS LLE on foam c toe tap 4 corners of square around stance LE light touch with UEs. x 10 bilateral ?  Double leg fitter rocker board fwd/back l& right/left light touch control focus x 10 reps 2 sets ?  Tandem stance on foam 1 min bilateral intermittent touch ?  Tandem walking on foam initiating on floor stepping onto beam trying to walk to end without touching. 8' X 6 ?  Side stepping on foam initiating on floor stepping onto beam trying to walk to end without touching. 8' X 6 ?  Heel/Toe raise alternating on foam x 10 reps straight motion, then worked diagonals 5 reps ea to build in functional inversion / eversion.  ? ?TherActivity Step up over & step down single UE support 20 Lt leg leading, 6 inch step (4" with foam compliant surface on top) ?  Leg press double leg 2 x 15 87 lbs, single leg 2x15 43 lbs ? ?Self-Care: PT reviewed on weaning down ankle brace with recommendation to start with 2 hours 2x/day then increase time. Twice per day enable her leg to not fatigue as much and progress total time. PT plans to increase to 3hrs 2x/day once limited fatigue, swelling & pain from no support. Pt verbalized understanding. ? ?12/30/2021 ?Therex: Recumbent bike seat 4 lvl 3 10 mins ?  Gastroc stretch 1 min x 2 ?  Heel raises with heels off step for greater range with light BUE support 10 reps.  ?   ?NeuroReed SLS LLE on foam c toe tap 4 corners of square around stance LE light touch with UEs. x 10 bilateral ?  Double leg fitter rocker board fwd/back l& right/left light touch control focus x 10 reps 2 sets ?  Tandem stance on foam 1 min x 2 bilateral ?  Tandem walking on foam initiating on floor stepping onto beam trying to walk to end without touching. 8' X 6 ?  Side stepping on foam initiating on floor stepping onto beam trying to walk to end without touching. 8' X 6 ?  Heel/Toe raise alternating on foam x 10 reps straight motion, then worked diagonals 5 reps ea to build in functional inversion /  eversion.  ? ?TherActivity Step up over & step down single UE support 2 x 10 Lt leg leading, 6 inch step ?  Leg press double leg 2 x 15 81 lbs, single leg 2x15 43 lbs ?PT educated on weaning down ankle brace with

## 2022-01-13 ENCOUNTER — Encounter: Payer: Self-pay | Admitting: Rehabilitative and Restorative Service Providers"

## 2022-01-13 ENCOUNTER — Other Ambulatory Visit: Payer: Self-pay

## 2022-01-13 ENCOUNTER — Ambulatory Visit (INDEPENDENT_AMBULATORY_CARE_PROVIDER_SITE_OTHER): Payer: No Typology Code available for payment source | Admitting: Rehabilitative and Restorative Service Providers"

## 2022-01-13 DIAGNOSIS — M25572 Pain in left ankle and joints of left foot: Secondary | ICD-10-CM

## 2022-01-13 DIAGNOSIS — M6281 Muscle weakness (generalized): Secondary | ICD-10-CM | POA: Diagnosis not present

## 2022-01-13 DIAGNOSIS — R262 Difficulty in walking, not elsewhere classified: Secondary | ICD-10-CM

## 2022-01-13 NOTE — Therapy (Signed)
?OUTPATIENT PHYSICAL THERAPY TREATMENT NOTE ?Patient Name: Christie Davis ?MRN: [id number] ?DOB:[birth year], 76 y.o., female ?Today's Date: 01/13/2022 ? ?PCP: Harlan Stains, MD ?REFERRING PROVIDER: Meredith Pel, MD ? ? PT End of Session - 01/13/22 1425   ? ? Visit Number 18   ? Number of Visits 24   ? Date for PT Re-Evaluation 02/10/22   ? Authorization Type WC approved 12 visits   ? Authorization - Visit Number 7   ? Authorization - Number of Visits 12   ? Progress Note Due on Visit 19   ? PT Start Time 1426   ? PT Stop Time 1505   ? PT Time Calculation (min) 39 min   ? Activity Tolerance Patient tolerated treatment well   ? Behavior During Therapy [facility] for tasks assessed/performed   ? ?  ?  ? ?  ? ? ? ? ? ? ? ? ? ? ? ? ? ?Past Medical History:  ?Diagnosis Date  ? Chronic cough   ? off and on-  ? Hypertension   ? Medical history non-contributory   ? Wears glasses   ? ?Past Surgical History:  ?Procedure Laterality Date  ? COLONOSCOPY    ? ORIF HUMERUS FRACTURE Right 12/06/2012  ? Procedure: OPEN REDUCTION INTERNAL FIXATION (ORIF) PROXIMAL HUMERUS FRACTURE;  Surgeon: Nita Sells, MD;  Location: [facility];  Service: Orthopedics;  Laterality: Right;  ? RECTAL TUMOR BY PROCTOTOMY EXCISION  2007  ? TUBAL LIGATION    ? ?There are no problems to display for this patient. ? ? ?REFERRING DIAG: M76.720N (ICD-10-CM) - Closed fracture of left ankle, initial encounter ? ?ONSET DATE: 10/22/2021 ? ?THERAPY DIAG:  ?Pain in left ankle and joints of left foot ? ?Muscle weakness (generalized) ? ?Difficulty in walking, not elsewhere classified ? ?PERTINENT HISTORY: HTN.  Injury 10/22/2021 with ED visit on 10/23/2021 ? ?PRECAUTIONS: None ? ?SUBJECTIVE:  Pt indicated having pressure point on top of foot about where the brace puts pressure on midfoot.  Some discomfort noted from area.  ? ?PAIN:  ? ?Are you having pain? No ?NPRS scale:  no specific pain related to fracture complaints.  ?Pain location:   ?Pain orientation: Lt ankle/foot ?PAIN TYPE:  ?Pain description:  ?Aggravating factors: ?Relieving factors: ? ?01/06/2022:  Patient Specific Functional Scale ?  -stairs: 7/10 (10 being no problem) ?  - walking 5/10 ? ?OBJECTIVE:  ?PATIENT SURVEYS:  ?12/09/2021: FOTO ankle update: 63% ?11/06/2021: FOTO Ankle intake: 8   predicted:  50 ?  ?COGNITION: ?11/06/2021: Overall cognitive status: Within functional limits for tasks assessed                  ?  ?PALPATION: ?11/06/2021: unable to palpate (cast on) ?  ?LE AROM/PROM: ?  ?A/PROM Right ?11/06/21 Left ?11/19/21 Left ?11/25/2021 ?Measured in seated knee 90 deg Left ?12/09/2021 ?Measured in seated knee 90 deg  ?Hip flexion        ?Hip extension        ?Hip abduction        ?Hip adduction        ?Hip internal rotation        ?Hip external rotation        ?Knee flexion        ?Knee extension        ?Ankle dorsiflexion    A: 4* AROM 6 AROM 12  ?Ankle plantarflexion   A: 29*  AROM 40 AROM 40  ?Ankle inversion  A: 11*  AROM 14 AROM 26  ?Ankle eversion   A: 1*  AROM 20 AROM 22  ? (Blank rows = not tested) ?  ?LE MMT: ?  ?MMT Right ?11/06/21 Left ?11/06/21 Left ?11/25/2021 Left ?12/09/2021 Left ?12/18/2021 Left ?01/06/2022  ?Hip flexion 5/5 4/5      ?Hip extension          ?Hip abduction          ?Hip adduction          ?Hip internal rotation          ?Hip external rotation          ?Knee flexion 5/5 4/5      ?Knee extension 5/5 4/5      ?Ankle dorsiflexion 5/5   5/5 5/5    ?Ankle plantarflexion     2+/5 2+/5 (unable to assess in WB) 2+/5 - unable to perform single leg heel raise 3/5 - able to clear for 1 rep  ?Ankle inversion 5/5   4/5 4/5    ?Ankle eversion 5/5   4/5 5/5    ? (Blank rows = not tested) ?  ?UE MMT: ?MMT Right ?11/06/21 Left ?11/06/21  ?shoulder flexion 4+/5 5/5  ?shoulder extension      ?shoulder abduction 4/5 5/5  ?shoulder adduction      ?shoulder internal rotation 5/5 5/5  ?shoulder external rotation 4/5 5/5  ?elbow flexion 5/5 5/5  ?elbow extension 5/5 5/5  ?       ?        ?       ?       ?  ?  ?LOWER EXTREMITY SPECIAL TESTS:  ?11/06/2021: none performed today ?  ?FUNCTIONAL TESTS:  ?11/06/2021: non performed today ?  ?GAIT: ?12/16/2021: Ambulation without boot c quad cane in Rt UE ? ?12/04/2021:  Ambulation household distances in boot c quad cane in Rt UE c primarily step to gait pattern.  ? ?11/06/2021: Distance walked: 15 ft ?Assistive device utilized: FWW ?Level of assistance: CGA to Min A ?Comments: step to gait pattern c NWB on Lt leg ? ?TODAY'S TREATMENT: ?01/13/2022 ? ?Therex: Recumbent bike lvl 3 10 mins seat 5 ?  Calf raise on leg press Lt leg 3 x 10 50 lbs  ?  Incline board stretch 1 min x 2 ?   ?NeuroReed SLS c toe tap on cones (anterior/medial, anterior, anterior/lateral) x 10 each , performed bilateral ?  SLS c slider light touch fwd, lateral, back x 10 each bilateral ?  Tandem stance on foam 1 min x 2 bilateral ?  Fitter rocker board fwd/back light taps 30x each way ?   ? ?01/08/2022 ? ?Therex: Recumbent bike lvl 3 10 mins seat 5 ?  Calf raise on leg press Lt leg 3 x 10 50 lbs  ?  Incline board stretch 1 min x 3 ?   ?NeuroReed SLS c toe tap on cones (anterior/medial, anterior, anterior/lateral) x 10 each , performed bilateral ?  Tandem ambulation on foam in //bars 8 ft x 6 fwd/rev each occasional HHA on bars required ?  Fitter rocker board fwd/back light taps 30x each way ?   ? ?TherActivity Step up/over/down on Lt leg 6 inch step no UE assist x 10 each ?  Single leg press Lt 50 lbs 2 x 15, Rt 50 lbs 2 x 15 ? ?01/06/2022 ? ?Therex: UBE LE only bike lvl 4.0 6 mins, seat 9 ?  Double leg  heel raise up, majority to Lt on lowering 2 x 10 c handrail assist for UE.  ?  Incline board stretch 1 min x 3 ?   ?NeuroReed SLS c toe tap fwd/lateral/reverse light touch contralateral leg x 10 bilateral on airex foam ?  Tandem ambulation on foam in //bars 8 ft x 10 fwd/rev occasional HHA on bars required ?   ? ?TherActivity Step up 2x 10 Lt leg leading, 6 inch step ?  Step up/over/down on Lt  leg 6 inch step, on 4 inch step c 2 inch foam on top (total 6 inch) no UE assist x 10 each ?  Flight of stairs reciprocal gait pattern with one handrail assist up/down full flight in building x 1 ? ?01/01/2022 ?Therex: Recumbent bike seat 4 lvl 3 10 mins ?  Gastroc stretch 1 min x 2 on incline board runner's step ?  Heel raises off incline board in straddle step with light BUE support 10 reps.  ?   ?NeuroReed SLS LLE on foam c toe tap 4 corners of square around stance LE light touch with UEs. x 10 bilateral ?  Double leg fitter rocker board fwd/back l& right/left light touch control focus x 10 reps 2 sets ?  Tandem stance on foam 1 min bilateral intermittent touch ?  Tandem walking on foam initiating on floor stepping onto beam trying to walk to end without touching. 8' X 6 ?  Side stepping on foam initiating on floor stepping onto beam trying to walk to end without touching. 8' X 6 ?  Heel/Toe raise alternating on foam x 10 reps straight motion, then worked diagonals 5 reps ea to build in functional inversion / eversion.  ? ?TherActivity Step up over & step down single UE support 20 Lt leg leading, 6 inch step (4" with foam compliant surface on top) ?  Leg press double leg 2 x 15 87 lbs, single leg 2x15 43 lbs ? ?Self-Care: PT reviewed on weaning down ankle brace with recommendation to start with 2 hours 2x/day then increase time. Twice per day enable her leg to not fatigue as much and progress total time. PT plans to increase to 3hrs 2x/day once limited fatigue, swelling & pain from no support. Pt verbalized understanding. ? ?PATIENT EDUCATION:  ?12/16/2021:  ?Education details: HEP progression ?Person educated: Patient ?Education method: Explanation, Demonstration, Verbal cues ?Education comprehension: verbalized understanding and returned demonstration ? ?  ?  ?HOME EXERCISE PROGRAM: ?Access Code: [email or web address] ?URL: [email or web address] ?Date: 12/16/2021 ?Prepared by: Scot Jun ? ?Exercises ?-  Seated Straight Leg Heel Taps  - 1-2 x daily - 7 x weekly - 3 sets - 10 reps ?- Long Sitting Ankle Eversion with Resistance  - 2 x daily - 7 x weekly - 3 sets - 10 reps ?- Long Sitting Ankle Inversion with Resi

## 2022-01-15 ENCOUNTER — Other Ambulatory Visit: Payer: Self-pay

## 2022-01-15 ENCOUNTER — Encounter: Payer: Self-pay | Admitting: Rehabilitative and Restorative Service Providers"

## 2022-01-15 ENCOUNTER — Ambulatory Visit (INDEPENDENT_AMBULATORY_CARE_PROVIDER_SITE_OTHER): Payer: No Typology Code available for payment source | Admitting: Rehabilitative and Restorative Service Providers"

## 2022-01-15 DIAGNOSIS — M6281 Muscle weakness (generalized): Secondary | ICD-10-CM

## 2022-01-15 DIAGNOSIS — M25572 Pain in left ankle and joints of left foot: Secondary | ICD-10-CM

## 2022-01-15 DIAGNOSIS — R262 Difficulty in walking, not elsewhere classified: Secondary | ICD-10-CM | POA: Diagnosis not present

## 2022-01-15 NOTE — Therapy (Signed)
?OUTPATIENT PHYSICAL THERAPY TREATMENT NOTE ?Patient Name: Christie Davis ?MRN: [id number] ?DOB:13-May-1946, 76 y.o., female ?Today's Date: 01/15/2022 ? ?PCP: Harlan Stains, MD ?REFERRING PROVIDER: Meredith Pel, MD ? ? PT End of Session - 01/15/22 1510   ? ? Visit Number 19   ? Number of Visits 24   ? Date for PT Re-Evaluation 02/10/22   ? Authorization Type WC approved 12 visits   ? Authorization - Visit Number 8   ? Authorization - Number of Visits 12   ? Progress Note Due on Visit 19   ? PT Start Time 1506   ? PT Stop Time 1027   ? PT Time Calculation (min) 38 min   ? Activity Tolerance Patient tolerated treatment well   ? Behavior During Therapy [facility] for tasks assessed/performed   ? ?  ?  ? ?  ? ? ? ? ? ? ? ? ? ? ? ? ? ? ?Past Medical History:  ?Diagnosis Date  ? Chronic cough   ? off and on-  ? Hypertension   ? Medical history non-contributory   ? Wears glasses   ? ?Past Surgical History:  ?Procedure Laterality Date  ? COLONOSCOPY    ? ORIF HUMERUS FRACTURE Right 12/06/2012  ? Procedure: OPEN REDUCTION INTERNAL FIXATION (ORIF) PROXIMAL HUMERUS FRACTURE;  Surgeon: Nita Sells, MD;  Location: [facility];  Service: Orthopedics;  Laterality: Right;  ? RECTAL TUMOR BY PROCTOTOMY EXCISION  2007  ? TUBAL LIGATION    ? ?There are no problems to display for this patient. ? ? ?REFERRING DIAG: O53.664Q (ICD-10-CM) - Closed fracture of left ankle, initial encounter ? ?ONSET DATE: 10/22/2021 ? ?THERAPY DIAG:  ?Pain in left ankle and joints of left foot ? ?Muscle weakness (generalized) ? ?Difficulty in walking, not elsewhere classified ? ?PERTINENT HISTORY: HTN.  Injury 10/22/2021 with ED visit on 10/23/2021 ? ?PRECAUTIONS: None ? ?SUBJECTIVE:  Pt indicated that spot on foot was some better but still sore.  No other pain indicated today.  ? ?PAIN:  ? ?Are you having pain? No ?NPRS scale:  no specific pain related to fracture complaints.  ?Pain location:  ?Pain orientation: Lt ankle/foot ?PAIN  TYPE:  ?Pain description:  ?Aggravating factors: ?Relieving factors: ? ?01/06/2022:  Patient Specific Functional Scale ?  -stairs: 7/10 (10 being no problem) ?  - walking 5/10 ? ?OBJECTIVE:  ?PATIENT SURVEYS:  ?01/15/2022: FOTO update: 67 % ? ?12/09/2021: FOTO ankle update: 63% ?11/06/2021: FOTO Ankle intake: 8   predicted:  50 ?  ?COGNITION: ?11/06/2021: Overall cognitive status: Within functional limits for tasks assessed                  ?  ?PALPATION: ?11/06/2021: unable to palpate (cast on) ?  ?LE AROM/PROM: ?  ?A/PROM Right ?11/06/21 Left ?11/19/21 Left ?11/25/2021 ?Measured in seated knee 90 deg Left ?12/09/2021 ?Measured in seated knee 90 deg  ?Hip flexion        ?Hip extension        ?Hip abduction        ?Hip adduction        ?Hip internal rotation        ?Hip external rotation        ?Knee flexion        ?Knee extension        ?Ankle dorsiflexion    A: 4* AROM 6 AROM 12  ?Ankle plantarflexion   A: 29*  AROM 40 AROM 40  ?Ankle inversion  A: 11*  AROM 14 AROM 26  ?Ankle eversion   A: 1*  AROM 20 AROM 22  ? (Blank rows = not tested) ?  ?LE MMT: ?  ?MMT Right ?11/06/21 Left ?11/06/21 Left ?11/25/2021 Left ?12/09/2021 Left ?12/18/2021 Left ?01/06/2022  ?Hip flexion 5/5 4/5      ?Hip extension          ?Hip abduction          ?Hip adduction          ?Hip internal rotation          ?Hip external rotation          ?Knee flexion 5/5 4/5      ?Knee extension 5/5 4/5      ?Ankle dorsiflexion 5/5   5/5 5/5    ?Ankle plantarflexion     2+/5 2+/5 (unable to assess in WB) 2+/5 - unable to perform single leg heel raise 3/5 - able to clear for 1 rep  ?Ankle inversion 5/5   4/5 4/5    ?Ankle eversion 5/5   4/5 5/5    ? (Blank rows = not tested) ?  ?UE MMT: ?MMT Right ?11/06/21 Left ?11/06/21  ?shoulder flexion 4+/5 5/5  ?shoulder extension      ?shoulder abduction 4/5 5/5  ?shoulder adduction      ?shoulder internal rotation 5/5 5/5  ?shoulder external rotation 4/5 5/5  ?elbow flexion 5/5 5/5  ?elbow extension 5/5 5/5  ?       ?       ?        ?       ?  ?  ?LOWER EXTREMITY SPECIAL TESTS:  ?11/06/2021: none performed today ?  ?FUNCTIONAL TESTS:  ?11/06/2021: non performed today ?  ?GAIT: ?12/16/2021: Ambulation without boot c quad cane in Rt UE ? ?12/04/2021:  Ambulation household distances in boot c quad cane in Rt UE c primarily step to gait pattern.  ? ?11/06/2021: Distance walked: 15 ft ?Assistive device utilized: FWW ?Level of assistance: CGA to Min A ?Comments: step to gait pattern c NWB on Lt leg ? ?TODAY'S TREATMENT: ?01/15/2022 ? ?Therex: Recumbent bike lvl 3 10 mins seat 5 ?  Calf raise on leg press Lt leg 2 x 15 50 lbs slow eccentric focus ?  Single leg press 2 x 15 bilateral 50 lbs ?  Incline board stretch 1 min x 3 ?  Standing PF c eccentric lowering majority of weight on Lt 3 x10 ?   ?NeuroReed Lateral stepping 3 cones x 10 bilateral ?  SLS c slider light touch fwd, lateral, back x 10 each bilateral ?  Fitter rocker board fwd/back light taps 30x each way ? ?01/13/2022 ? ?Therex: Recumbent bike lvl 3 10 mins seat 5 ?  Calf raise on leg press Lt leg 3 x 10 50 lbs  ?  Incline board stretch 1 min x 2 ?   ?NeuroReed SLS c toe tap on cones (anterior/medial, anterior, anterior/lateral) x 10 each , performed bilateral ?  SLS c slider light touch fwd, lateral, back x 10 each bilateral ?  Tandem stance on foam 1 min x 2 bilateral ?  Fitter rocker board fwd/back light taps 30x each way ?   ? ?01/08/2022 ? ?Therex: Recumbent bike lvl 3 10 mins seat 5 ?  Calf raise on leg press Lt leg 3 x 10 50 lbs  ?  Incline board stretch 1 min x 3 ?   ?NeuroReed  SLS c toe tap on cones (anterior/medial, anterior, anterior/lateral) x 10 each , performed bilateral ?  Tandem ambulation on foam in //bars 8 ft x 6 fwd/rev each occasional HHA on bars required ?  Fitter rocker board fwd/back light taps 30x each way ?   ? ?TherActivity Step up/over/down on Lt leg 6 inch step no UE assist x 10 each ?  Single leg press Lt 50 lbs 2 x 15, Rt 50 lbs 2 x 15 ? ?01/06/2022 ? ?Therex: UBE LE  only bike lvl 4.0 6 mins, seat 9 ?  Double leg heel raise up, majority to Lt on lowering 2 x 10 c handrail assist for UE.  ?  Incline board stretch 1 min x 3 ?   ?NeuroReed SLS c toe tap fwd/lateral/reverse light touch contralateral leg x 10 bilateral on airex foam ?  Tandem ambulation on foam in //bars 8 ft x 10 fwd/rev occasional HHA on bars required ?   ? ?TherActivity Step up 2x 10 Lt leg leading, 6 inch step ?  Step up/over/down on Lt leg 6 inch step, on 4 inch step c 2 inch foam on top (total 6 inch) no UE assist x 10 each ?  Flight of stairs reciprocal gait pattern with one handrail assist up/down full flight in building x 1 ? ?PATIENT EDUCATION:  ?12/16/2021:  ?Education details: HEP progression ?Person educated: Patient ?Education method: Explanation, Demonstration, Verbal cues ?Education comprehension: verbalized understanding and returned demonstration ? ?  ?  ?HOME EXERCISE PROGRAM: ?Access Code: [email or web address] ?URL: [email or web address] ?Date: 12/16/2021 ?Prepared by: Scot Jun ? ?Exercises ?- Seated Straight Leg Heel Taps  - 1-2 x daily - 7 x weekly - 3 sets - 10 reps ?- Long Sitting Ankle Eversion with Resistance  - 2 x daily - 7 x weekly - 3 sets - 10 reps ?- Long Sitting Ankle Inversion with Resistance  - 2 x daily - 7 x weekly - 3 sets - 10 reps ?- Long Sitting Ankle Dorsiflexion with Anchored Resistance (Mirrored)  - 2 x daily - 7 x weekly - 3 sets - 10 reps ?- Standing Gastroc Stretch on Step  - 2 x daily - 7 x weekly - 1 sets - 5 reps - 30 sec hold ?- Single Leg Stance  - 1 x daily - 7 x weekly - 1 sets - 5 reps - 20-30 hold ?- Tandem Stance  - 1 x daily - 7 x weekly - 1 sets - 5 reps - 20-30 hold ?- Standing Heel Raise  - 1 x daily - 7 x weekly - 1-2 sets - 10 reps ? ?  ?  ?ASSESSMENT: ?  ?CLINICAL IMPRESSION: ?Positive report indicated off removal of sleeve.  Pt continued to show progress towards return to PLOF.  PF strength on Lt continued to show deficits but making improvements  overall.  ? ?  ?REHAB POTENTIAL: Good ?  ?CLINICAL DECISION MAKING: Stable/uncomplicated ?  ?EVALUATION COMPLEXITY: Low ?  ?GOALS: ?Goals reviewed with patient? Yes ?  ?SHORT TERM GOALS: ?  ?STG Name

## 2022-01-20 ENCOUNTER — Ambulatory Visit (INDEPENDENT_AMBULATORY_CARE_PROVIDER_SITE_OTHER): Payer: No Typology Code available for payment source | Admitting: Rehabilitative and Restorative Service Providers"

## 2022-01-20 ENCOUNTER — Encounter: Payer: Self-pay | Admitting: Rehabilitative and Restorative Service Providers"

## 2022-01-20 ENCOUNTER — Other Ambulatory Visit: Payer: Self-pay

## 2022-01-20 DIAGNOSIS — R262 Difficulty in walking, not elsewhere classified: Secondary | ICD-10-CM

## 2022-01-20 DIAGNOSIS — M6281 Muscle weakness (generalized): Secondary | ICD-10-CM

## 2022-01-20 DIAGNOSIS — M25572 Pain in left ankle and joints of left foot: Secondary | ICD-10-CM | POA: Diagnosis not present

## 2022-01-20 NOTE — Therapy (Signed)
?OUTPATIENT PHYSICAL THERAPY TREATMENT NOTE ?Patient Name: Christie Davis ?MRN: [id number] ?DOB:December 05, 1945, 76 y.o., female ?Today's Date: 01/20/2022 ? ?PCP: Harlan Stains, MD ?REFERRING PROVIDER: Meredith Pel, MD ? ? PT End of Session - 01/20/22 1437   ? ? Visit Number 20   ? Number of Visits 24   ? Date for PT Re-Evaluation 02/10/22   ? Authorization Type WC approved 12 visits   ? Authorization - Visit Number 9   ? Authorization - Number of Visits 12   ? Progress Note Due on Visit 19   ? PT Start Time 1427   ? PT Stop Time 1506   ? PT Time Calculation (min) 39 min   ? Activity Tolerance Patient tolerated treatment well   ? Behavior During Therapy [facility] for tasks assessed/performed   ? ?  ?  ? ?  ? ? ? ? ? ? ? ? ? ? ? ? ? ? ? ?Past Medical History:  ?Diagnosis Date  ? Chronic cough   ? off and on-  ? Hypertension   ? Medical history non-contributory   ? Wears glasses   ? ?Past Surgical History:  ?Procedure Laterality Date  ? COLONOSCOPY    ? ORIF HUMERUS FRACTURE Right 12/06/2012  ? Procedure: OPEN REDUCTION INTERNAL FIXATION (ORIF) PROXIMAL HUMERUS FRACTURE;  Surgeon: Nita Sells, MD;  Location: [place];  Service: Orthopedics;  Laterality: Right;  ? RECTAL TUMOR BY PROCTOTOMY EXCISION  2007  ? TUBAL LIGATION    ? ?There are no problems to display for this patient. ? ? ?REFERRING DIAG: O35.009F (ICD-10-CM) - Closed fracture of left ankle, initial encounter ? ?ONSET DATE: 10/22/2021 ? ?THERAPY DIAG:  ?Pain in left ankle and joints of left foot ? ?Muscle weakness (generalized) ? ?Difficulty in walking, not elsewhere classified ? ?PERTINENT HISTORY: HTN.  Injury 10/22/2021 with ED visit on 10/23/2021 ? ?PRECAUTIONS: None ? ?SUBJECTIVE:  Pt indicated that pressure point in foot from brace continued to be better without it.  Pt stated last night she was sleeping and when waking up noticed soreness in lateral lower leg/ankle.  Reported it as soreness not pain.  ? ?PAIN:  ? ?Are you  having pain? No ?NPRS scale:  no specific pain related to fracture complaints.  6-7/10 soreness at worst ?Pain orientation: Lt ankle/calf ?Pain description: soreness ?Aggravating factors: nighttime ?Relieving factors: ? ?01/06/2022:  Patient Specific Functional Scale ?  -stairs: 7/10 (10 being no problem) ?  - walking 5/10 ? ?OBJECTIVE:  ?PATIENT SURVEYS:  ?01/15/2022: FOTO update: 67 % ? ?12/09/2021: FOTO ankle update: 63% ?11/06/2021: FOTO Ankle intake: 8   predicted:  50 ?  ?COGNITION: ?11/06/2021: Overall cognitive status: Within functional limits for tasks assessed                  ?  ?PALPATION: ?11/06/2021: unable to palpate (cast on) ?  ?LE AROM/PROM: ?  ?A/PROM Right ?11/06/21 Left ?11/19/21 Left ?11/25/2021 ?Measured in seated knee 90 deg Left ?12/09/2021 ?Measured in seated knee 90 deg  ?Hip flexion        ?Hip extension        ?Hip abduction        ?Hip adduction        ?Hip internal rotation        ?Hip external rotation        ?Knee flexion        ?Knee extension        ?Ankle dorsiflexion  A: 4* AROM 6 AROM 12  ?Ankle plantarflexion   A: 29*  AROM 40 AROM 40  ?Ankle inversion   A: 11*  AROM 14 AROM 26  ?Ankle eversion   A: 1*  AROM 20 AROM 22  ? (Blank rows = not tested) ?  ?LE MMT: ?  ?MMT Right ?11/06/21 Left ?11/06/21 Left ?11/25/2021 Left ?12/09/2021 Left ?12/18/2021 Left ?01/06/2022  ?Hip flexion 5/5 4/5      ?Hip extension          ?Hip abduction          ?Hip adduction          ?Hip internal rotation          ?Hip external rotation          ?Knee flexion 5/5 4/5      ?Knee extension 5/5 4/5      ?Ankle dorsiflexion 5/5   5/5 5/5    ?Ankle plantarflexion     2+/5 2+/5 (unable to assess in WB) 2+/5 - unable to perform single leg heel raise 3/5 - able to clear for 1 rep  ?Ankle inversion 5/5   4/5 4/5    ?Ankle eversion 5/5   4/5 5/5    ? (Blank rows = not tested) ?  ?UE MMT: ?MMT Right ?11/06/21 Left ?11/06/21  ?shoulder flexion 4+/5 5/5  ?shoulder extension      ?shoulder abduction 4/5 5/5  ?shoulder adduction       ?shoulder internal rotation 5/5 5/5  ?shoulder external rotation 4/5 5/5  ?elbow flexion 5/5 5/5  ?elbow extension 5/5 5/5  ?       ?       ?       ?       ?  ?  ?LOWER EXTREMITY SPECIAL TESTS:  ?11/06/2021: none performed today ?  ?FUNCTIONAL TESTS:  ?01/20/2022:  Lt SLS:  5 seconds ? ?11/06/2021: non performed today ?  ?GAIT: ?12/16/2021: Ambulation without boot c quad cane in Rt UE ? ?12/04/2021:  Ambulation household distances in boot c quad cane in Rt UE c primarily step to gait pattern.  ? ?11/06/2021: Distance walked: 15 ft ?Assistive device utilized: FWW ?Level of assistance: CGA to Min A ?Comments: step to gait pattern c NWB on Lt leg ? ?TODAY'S TREATMENT: ?01/20/2022 ? ?Therex: Recumbent bike lvl 4 10 mins seat 5 ?  Calf raise on leg press Lt leg 2 x 15 50 lbs slow eccentric focus ?  Single leg press 2 x 15 bilateral 56 lbs ?  Incline board stretch 1 min x 3 ?  Standing PF c eccentric lowering majority of weight on Lt 2 x 15 ?   ?NeuroReed SLS on foam occasional hand touch on rail 30 sec x 3 bilateral ?  SLS c slider light touch fwd, lateral, back 2 x 10 each bilateral ?   ?01/15/2022 ? ?Therex: Recumbent bike lvl 3 10 mins seat 5 ?  Calf raise on leg press Lt leg 2 x 15 50 lbs slow eccentric focus ?  Single leg press 2 x 15 bilateral 50 lbs ?  Incline board stretch 1 min x 3 ?  Standing PF c eccentric lowering majority of weight on Lt 3 x10 ?   ?NeuroReed Lateral stepping 3 cones x 10 bilateral ?  SLS c slider light touch fwd, lateral, back x 10 each bilateral ?  Fitter rocker board fwd/back light taps 30x each way ? ?01/13/2022 ? ?Therex: Recumbent  bike lvl 3 10 mins seat 5 ?  Calf raise on leg press Lt leg 3 x 10 50 lbs  ?  Incline board stretch 1 min x 2 ?   ?NeuroReed SLS c toe tap on cones (anterior/medial, anterior, anterior/lateral) x 10 each , performed bilateral ?  SLS c slider light touch fwd, lateral, back x 10 each bilateral ?  Tandem stance on foam 1 min x 2 bilateral ?  Fitter rocker board fwd/back  light taps 30x each way ?   ? ?01/08/2022 ? ?Therex: Recumbent bike lvl 3 10 mins seat 5 ?  Calf raise on leg press Lt leg 3 x 10 50 lbs  ?  Incline board stretch 1 min x 3 ?   ?NeuroReed SLS c toe tap on cones (anterior/medial, anterior, anterior/lateral) x 10 each , performed bilateral ?  Tandem ambulation on foam in //bars 8 ft x 6 fwd/rev each occasional HHA on bars required ?  Fitter rocker board fwd/back light taps 30x each way ?   ? ?TherActivity Step up/over/down on Lt leg 6 inch step no UE assist x 10 each ?  Single leg press Lt 50 lbs 2 x 15, Rt 50 lbs 2 x 15 ? ? ? ?PATIENT EDUCATION:  ?12/16/2021:  ?Education details: HEP progression ?Person educated: Patient ?Education method: Explanation, Demonstration, Verbal cues ?Education comprehension: verbalized understanding and returned demonstration ? ?  ?  ?HOME EXERCISE PROGRAM: ?Access Code: [email or web address] ?URL: [email or web address] ?Date: 12/16/2021 ?Prepared by: Scot Jun ? ?Exercises ?- Seated Straight Leg Heel Taps  - 1-2 x daily - 7 x weekly - 3 sets - 10 reps ?- Long Sitting Ankle Eversion with Resistance  - 2 x daily - 7 x weekly - 3 sets - 10 reps ?- Long Sitting Ankle Inversion with Resistance  - 2 x daily - 7 x weekly - 3 sets - 10 reps ?- Long Sitting Ankle Dorsiflexion with Anchored Resistance (Mirrored)  - 2 x daily - 7 x weekly - 3 sets - 10 reps ?- Standing Gastroc Stretch on Step  - 2 x daily - 7 x weekly - 1 sets - 5 reps - 30 sec hold ?- Single Leg Stance  - 1 x daily - 7 x weekly - 1 sets - 5 reps - 20-30 hold ?- Tandem Stance  - 1 x daily - 7 x weekly - 1 sets - 5 reps - 20-30 hold ?- Standing Heel Raise  - 1 x daily - 7 x weekly - 1-2 sets - 10 reps ? ?  ?  ?ASSESSMENT: ?  ?CLINICAL IMPRESSION: ?Complaints of soreness from overnight was reported but not specifically observed or noted as a limiting factor within clinic.  Continued strengthening to PF and balance improvements still show as necessity for treatment at this time.   Overall Pt is showing improvements.  ? ?  ?REHAB POTENTIAL: Good ?  ?CLINICAL DECISION MAKING: Stable/uncomplicated ?  ?EVALUATION COMPLEXITY: Low ?  ?GOALS: ?Goals reviewed with patient? Yes ?  ?SHORT TE

## 2022-01-22 ENCOUNTER — Ambulatory Visit (INDEPENDENT_AMBULATORY_CARE_PROVIDER_SITE_OTHER): Payer: No Typology Code available for payment source | Admitting: Rehabilitative and Restorative Service Providers"

## 2022-01-22 ENCOUNTER — Encounter: Payer: Self-pay | Admitting: Rehabilitative and Restorative Service Providers"

## 2022-01-22 ENCOUNTER — Other Ambulatory Visit: Payer: Self-pay

## 2022-01-22 DIAGNOSIS — M6281 Muscle weakness (generalized): Secondary | ICD-10-CM | POA: Diagnosis not present

## 2022-01-22 DIAGNOSIS — R262 Difficulty in walking, not elsewhere classified: Secondary | ICD-10-CM

## 2022-01-22 DIAGNOSIS — M25572 Pain in left ankle and joints of left foot: Secondary | ICD-10-CM

## 2022-01-22 NOTE — Therapy (Signed)
?OUTPATIENT PHYSICAL THERAPY TREATMENT NOTE / PROGRESS NOTE ?Patient Name: Christie Davis ?MRN: [id number] ?DOB:May 24, 1946, 76 y.o., female ?Today's Date: 01/22/2022 ? ?PCP: Harlan Stains, MD ?REFERRING PROVIDER: Meredith Pel, MD ? ? PT End of Session - 01/22/22 1430   ? ? Visit Number 21   ? Number of Visits 24   ? Date for PT Re-Evaluation 02/10/22   ? Authorization Type WC approved 12 visits   ? Authorization - Visit Number 10   ? Authorization - Number of Visits 12   ? Progress Note Due on Visit 24   ? PT Start Time 1427   ? PT Stop Time 1506   ? PT Time Calculation (min) 39 min   ? Activity Tolerance Patient tolerated treatment well   ? Behavior During Therapy [facility] for tasks assessed/performed   ? ?  ?  ? ?  ? ? ?Progress Note ?Reporting Period 12/11/2021 to 01/22/2022 ? ?See note below for Objective Data and Assessment of Progress/Goals.  ? ? ? ?Past Medical History:  ?Diagnosis Date  ? Chronic cough   ? off and on-  ? Hypertension   ? Medical history non-contributory   ? Wears glasses   ? ?Past Surgical History:  ?Procedure Laterality Date  ? COLONOSCOPY    ? ORIF HUMERUS FRACTURE Right 12/06/2012  ? Procedure: OPEN REDUCTION INTERNAL FIXATION (ORIF) PROXIMAL HUMERUS FRACTURE;  Surgeon: Nita Sells, MD;  Location: [place];  Service: Orthopedics;  Laterality: Right;  ? RECTAL TUMOR BY PROCTOTOMY EXCISION  2007  ? TUBAL LIGATION    ? ?There are no problems to display for this patient. ? ? ?REFERRING DIAG: H85.277O (ICD-10-CM) - Closed fracture of left ankle, initial encounter ? ?ONSET DATE: 10/22/2021 ? ?THERAPY DIAG:  ?Pain in left ankle and joints of left foot ? ?Muscle weakness (generalized) ? ?Difficulty in walking, not elsewhere classified ? ?PERTINENT HISTORY: HTN.  Injury 10/22/2021 with ED visit on 10/23/2021 ? ?PRECAUTIONS: None ? ?SUBJECTIVE:  Pt indicated nighttime continued to be the time of the worse feeling.  Pt indicated mostly during the is ok.  Pt described  symptoms as discomfort.  Pt indicated overall improvement back to PLOF at approx. 80% at this time.  ? ?PAIN:  ? ?Are you having pain? No ?NPRS scale:  No pain indicated upon arrival.  ?Pain orientation: Lt ankle/calf ?Pain description: soreness ?Aggravating factors: nighttime, prolonged WB activity ?Relieving factors: rest ? ?Patient Specific Functional Scale ?  - stairs:  ?01/22/2022:  8/10 ?01/06/2022:   7/10 (10 being no problem) ?  - walking  ?   01/22/2022: 8/10 ?01/06/2022:   5/10 ? ?OBJECTIVE:  ?PATIENT SURVEYS:  ?01/15/2022: FOTO update: 67 % ?12/09/2021: FOTO ankle update: 63% ?11/06/2021: FOTO Ankle intake: 8   predicted:  50 ?  ?COGNITION: ?11/06/2021: Overall cognitive status: Within functional limits for tasks assessed                  ?  ?PALPATION: ?11/06/2021: unable to palpate (cast on) ?  ?LE AROM/PROM: ?  ?A/PROM Right ?11/06/21 Left ?11/19/21 Left ?11/25/2021 ?Measured in seated knee 90 deg Left ?12/09/2021 ?Measured in seated knee 90 deg Left ?01/22/2022 ?Measured in seated knee 90 deg  ?Hip flexion         ?Hip extension         ?Hip abduction         ?Hip adduction         ?Hip internal rotation         ?  Hip external rotation         ?Knee flexion         ?Knee extension         ?Ankle dorsiflexion    A: 4* AROM 6 AROM 12 AROM 20  ?Ankle plantarflexion   A: 29*  AROM 40 AROM 40 WFL  ?Ankle inversion   A: 11*  AROM 14 AROM 26 AROM 26  ?Ankle eversion   A: 1*  AROM 20 AROM 22 AROM 28  ? (Blank rows = not tested) ?  ?LE MMT: ?  ?MMT Right ?11/06/21 Left ?11/06/21 Left ?11/25/2021 Left ?12/09/2021 Left ?12/18/2021 Left ?01/06/2022 Left ?01/22/2022  ?Hip flexion 5/5 4/5       ?Hip extension           ?Hip abduction           ?Hip adduction           ?Hip internal rotation           ?Hip external rotation           ?Knee flexion 5/5 4/5       ?Knee extension 5/5 4/5       ?Ankle dorsiflexion 5/5   5/5 5/5   5/5  ?Ankle plantarflexion     2+/5 2+/5 (unable to assess in WB) 2+/5 - unable to perform single leg heel raise 3/5 -  able to clear for 1 rep 3/5 - able to clear for 1 rep SL PF  ?Ankle inversion 5/5   4/5 4/5   5/5  ?Ankle eversion 5/5   4/5 5/5   5/5  ? (Blank rows = not tested) ?  ?UE MMT: ?MMT Right ?11/06/21 Left ?11/06/21  ?shoulder flexion 4+/5 5/5  ?shoulder extension      ?shoulder abduction 4/5 5/5  ?shoulder adduction      ?shoulder internal rotation 5/5 5/5  ?shoulder external rotation 4/5 5/5  ?elbow flexion 5/5 5/5  ?elbow extension 5/5 5/5  ?       ?       ?       ?       ?  ?  ?LOWER EXTREMITY SPECIAL TESTS:  ?11/06/2021: none performed today ?  ?FUNCTIONAL TESTS:  ? ?01/20/2022:  Lt SLS:  5 seconds ?11/06/2021: non performed today ?  ?GAIT: ?01/22/2022:  Ambulation independently in clinic, to and from.  ? ?12/16/2021: Ambulation without boot c quad cane in Rt UE ? ?12/04/2021:  Ambulation household distances in boot c quad cane in Rt UE c primarily step to gait pattern.  ? ?11/06/2021: Distance walked: 15 ft ?Assistive device utilized: FWW ?Level of assistance: CGA to Min A ?Comments: step to gait pattern c NWB on Lt leg ? ?TODAY'S TREATMENT: ?01/22/2022 ? ?Therex: Recumbent bike lvl 3 10 mins seat 5 ?  Incline board stretch 1 min x 3 ?  Double leg heel raise on incline board, Lt leg lowering eccentrically 3 x 10  ?  Single leg press 2 x 15 bilateral 56 lbs ?   ?   ?NeuroReed SLS on ground 30 sec x 4 bilateral occasional to moderate HHA required ( cues for home use to promote improved balance control) ?  Lateral stepping 3 cones x 6 bilateral ? ?01/20/2022 ? ?Therex: Recumbent bike lvl 4 10 mins seat 5 ?  Calf raise on leg press Lt leg 2 x 15 50 lbs slow eccentric focus ?  Single leg press 2 x 15  bilateral 56 lbs ?  Incline board stretch 1 min x 3 ?  Standing PF c eccentric lowering majority of weight on Lt 2 x 15 ?   ?NeuroReed SLS on foam occasional hand touch on rail 30 sec x 3 bilateral ?  SLS c slider light touch fwd, lateral, back 2 x 10 each bilateral ?   ?01/15/2022 ? ?Therex: Recumbent bike lvl 3 10 mins seat 5 ?  Calf  raise on leg press Lt leg 2 x 15 50 lbs slow eccentric focus ?  Single leg press 2 x 15 bilateral 50 lbs ?  Incline board stretch 1 min x 3 ?  Standing PF c eccentric lowering majority of weight on Lt 3 x10 ?   ?NeuroReed Lateral stepping 3 cones x 10 bilateral ?  SLS c slider light touch fwd, lateral, back x 10 each bilateral ?  Fitter rocker board fwd/back light taps 30x each way ? ? ? ?PATIENT EDUCATION:  ?01/22/2022:  ?Education details: HEP review ?Person educated: Patient ?Education method: Explanation, Demonstration, Verbal cues ?Education comprehension: verbalized understanding and returned demonstration ? ?  ?  ?HOME EXERCISE PROGRAM: ?Access Code: [email or web address] ?URL: [email or web address] ?Date: 12/16/2021 ?Prepared by: Scot Jun ? ?Exercises ?- Seated Straight Leg Heel Taps  - 1-2 x daily - 7 x weekly - 3 sets - 10 reps ?- Long Sitting Ankle Eversion with Resistance  - 2 x daily - 7 x weekly - 3 sets - 10 reps ?- Long Sitting Ankle Inversion with Resistance  - 2 x daily - 7 x weekly - 3 sets - 10 reps ?- Long Sitting Ankle Dorsiflexion with Anchored Resistance (Mirrored)  - 2 x daily - 7 x weekly - 3 sets - 10 reps ?- Standing Gastroc Stretch on Step  - 2 x daily - 7 x weekly - 1 sets - 5 reps - 30 sec hold ?- Single Leg Stance  - 1 x daily - 7 x weekly - 1 sets - 5 reps - 20-30 hold ?- Tandem Stance  - 1 x daily - 7 x weekly - 1 sets - 5 reps - 20-30 hold ?- Standing Heel Raise  - 1 x daily - 7 x weekly - 1-2 sets - 10 reps ? ?  ?  ?ASSESSMENT: ?  ?CLINICAL IMPRESSION: ?Pt has attended 21 visits overall during course of treatment with 2 more approved visits remaining.  Pt has reported reduction in pain complaints to this point with overall improvement rated at 80%.  See objective data for updated information.  Pt has made significant gains since start of treatment but did continue to present c strength deficits in PF on Lt leg as well as balance control deficits bilaterally.  Overall  presentation can limit her ability from full PLOF c ambulation, stair navigation and other prolonged WB activity.  Pt has demonstrated good knowledge of HEP to this point and continued use will benefit her.  Return

## 2022-01-24 ENCOUNTER — Ambulatory Visit (INDEPENDENT_AMBULATORY_CARE_PROVIDER_SITE_OTHER): Payer: No Typology Code available for payment source

## 2022-01-24 ENCOUNTER — Ambulatory Visit (INDEPENDENT_AMBULATORY_CARE_PROVIDER_SITE_OTHER): Payer: No Typology Code available for payment source | Admitting: Surgical

## 2022-01-24 DIAGNOSIS — S82892A Other fracture of left lower leg, initial encounter for closed fracture: Secondary | ICD-10-CM

## 2022-01-26 ENCOUNTER — Encounter: Payer: Self-pay | Admitting: Orthopedic Surgery

## 2022-01-26 NOTE — Progress Notes (Signed)
? ?Office Visit Note ?  ?Patient: Christie Davis           ?Date of Birth: 12-18-1945           ?MRN: [id number] ?Visit Date: 01/24/2022 ?Requested by: Harlan Stains, MD ?[address] ?[address] ?[place],  [place] [address] ?PCP: Harlan Stains, MD ? ?Subjective: ?Chief Complaint  ?Patient presents with  ? Left Ankle - Follow-up  ? ? ?HPI: Christie Davis is a 76 y.o. female who presents to the office for follow-up of left ankle fracture.  Date of injury 10/22/2021.  She has been weightbearing in regular shoe and going to physical therapy 2 times per week.  She feels her ankle is getting stronger but her only concern is really about swelling.  She does not really have any pain with ambulation.  Does not really have to take any medications for pain.  Pain continues to improve.  Swelling comes and goes based on her activity.Marland Kitchen   ?             ?ROS: All systems reviewed are negative as they relate to the chief complaint within the history of present illness.  Patient denies fevers or chills. ? ?Assessment & Plan: ?Visit Diagnoses:  ?1. Closed fracture of left ankle, initial encounter   ? ? ?Plan: Patient is a 76 year old female who presents for evaluation following left ankle fracture.  Date of injury 10/22/2021.  She is progressing very well now about 3 months out from injury.  Ambulating in a regular shoe without much difficulty.  No tenderness over the fracture site today on exam.  She has no observable limp.  Radiographs of the left ankle show increased callus formation compared with prior radiographs.  Plan is continue with physical therapy and okay to transition to home exercise program when PT feels appropriate.  Follow-up with the office as needed.  Assured her that some continued swelling is normal for several months after the initial injury. ? ?Follow-Up Instructions: No follow-ups on file.  ? ?Orders:  ?Orders Placed This Encounter  ?Procedures  ? XR Ankle Complete Left  ? ?No orders of the defined  types were placed in this encounter. ? ? ? ? Procedures: ?No procedures performed ? ? ?Clinical Data: ?No additional findings. ? ?Objective: ?Vital Signs: There were no vitals taken for this visit. ? ?Physical Exam:  ?Constitutional: Patient appears well-developed ?HEENT:  ?Head: Normocephalic ?Eyes:EOM are normal ?Neck: Normal range of motion ?Cardiovascular: Normal rate ?Pulmonary/chest: Effort normal ?Neurologic: Patient is alert ?Skin: Skin is warm ?Psychiatric: Patient has normal mood and affect ? ?Ortho Exam: Ortho exam demonstrates left foot and ankle with 2+ DP pulse.  Mild swelling compared with the contralateral ankle.  No calf tenderness.  Negative Homans' sign.  Intact ankle dorsiflexion Complan flexion, inversion, eversion.  With the knee flexed, left ankle has about 10 degrees of dorsiflexion.  No tenderness to palpation over the fracture site. ? ?Specialty Comments:  ?No specialty comments available. ? ?Imaging: ?No results found. ? ? ?PMFS History: ?There are no problems to display for this patient. ? ?Past Medical History:  ?Diagnosis Date  ? Chronic cough   ? off and on-  ? Hypertension   ? Medical history non-contributory   ? Wears glasses   ?  ?No family history on file.  ?Past Surgical History:  ?Procedure Laterality Date  ? COLONOSCOPY    ? ORIF HUMERUS FRACTURE Right 12/06/2012  ? Procedure: OPEN REDUCTION INTERNAL FIXATION (ORIF) PROXIMAL  HUMERUS FRACTURE;  Surgeon: Nita Sells, MD;  Location: [place];  Service: Orthopedics;  Laterality: Right;  ? RECTAL TUMOR BY PROCTOTOMY EXCISION  2007  ? TUBAL LIGATION    ? ?Social History  ? ?Occupational History  ? Not on file  ?Tobacco Use  ? Smoking status: Never  ? Smokeless tobacco: Never  ?Substance and Sexual Activity  ? Alcohol use: No  ? Drug use: No  ? Sexual activity: Not on file  ? ? ? ? ?  ?

## 2022-01-27 ENCOUNTER — Encounter: Payer: Self-pay | Admitting: Rehabilitative and Restorative Service Providers"

## 2022-01-27 ENCOUNTER — Ambulatory Visit (INDEPENDENT_AMBULATORY_CARE_PROVIDER_SITE_OTHER): Payer: No Typology Code available for payment source | Admitting: Rehabilitative and Restorative Service Providers"

## 2022-01-27 DIAGNOSIS — R262 Difficulty in walking, not elsewhere classified: Secondary | ICD-10-CM | POA: Diagnosis not present

## 2022-01-27 DIAGNOSIS — M6281 Muscle weakness (generalized): Secondary | ICD-10-CM

## 2022-01-27 DIAGNOSIS — M25572 Pain in left ankle and joints of left foot: Secondary | ICD-10-CM | POA: Diagnosis not present

## 2022-01-27 NOTE — Therapy (Signed)
?OUTPATIENT PHYSICAL THERAPY TREATMENT NOTE ?Patient Name: Christie Davis ?MRN: [id number] ?DOB:11-Sep-1946, 76 y.o., female ?Today's Date: 01/27/2022 ? ?PCP: Harlan Stains, MD ?REFERRING PROVIDER: Meredith Pel, MD ? ? PT End of Session - 01/27/22 1432   ? ? Visit Number 22   ? Number of Visits 24   ? Date for PT Re-Evaluation 02/10/22   ? Authorization Type WC approved 12 visits   ? Authorization - Visit Number 11   ? Authorization - Number of Visits 12   ? Progress Note Due on Visit 24   ? PT Start Time 1429   ? PT Stop Time 8453   ? PT Time Calculation (min) 39 min   ? Activity Tolerance Patient tolerated treatment well   ? Behavior During Therapy [facility] for tasks assessed/performed   ? ?  ?  ? ?  ? ? ?Past Medical History:  ?Diagnosis Date  ? Chronic cough   ? off and on-  ? Hypertension   ? Medical history non-contributory   ? Wears glasses   ? ?Past Surgical History:  ?Procedure Laterality Date  ? COLONOSCOPY    ? ORIF HUMERUS FRACTURE Right 12/06/2012  ? Procedure: OPEN REDUCTION INTERNAL FIXATION (ORIF) PROXIMAL HUMERUS FRACTURE;  Surgeon: Nita Sells, MD;  Location: [place];  Service: Orthopedics;  Laterality: Right;  ? RECTAL TUMOR BY PROCTOTOMY EXCISION  2007  ? TUBAL LIGATION    ? ?There are no problems to display for this patient. ? ? ?REFERRING DIAG: M46.803O (ICD-10-CM) - Closed fracture of left ankle, initial encounter ? ?ONSET DATE: 10/22/2021 ? ?THERAPY DIAG:  ?Pain in left ankle and joints of left foot ? ?Muscle weakness (generalized) ? ?Difficulty in walking, not elsewhere classified ? ?PERTINENT HISTORY: HTN.  Injury 10/22/2021 with ED visit on 10/23/2021 ? ?PRECAUTIONS: None ? ?SUBJECTIVE:  Pt indicated good reporting overall from MD visit.  Pt indicated no pain from weekend.  Pt indicated she was going to have to stay motivated for home program.  Pt indicated swelling still can occur.  ? ?PAIN:  ? ?Are you having pain? No ?NPRS scale:  No pain indicated upon  arrival.  ?Pain orientation: Lt ankle/calf ?Pain description: soreness ?Aggravating factors: nighttime, prolonged WB activity ?Relieving factors: rest ? ?Patient Specific Functional Scale ?  - stairs:  ?01/22/2022:  8/10 ?01/06/2022:   7/10 (10 being no problem) ?  - walking  ?   01/22/2022: 8/10 ?01/06/2022:   5/10 ? ?OBJECTIVE:  ?PATIENT SURVEYS:  ?01/15/2022: FOTO update: 67 % ?12/09/2021: FOTO ankle update: 63% ?11/06/2021: FOTO Ankle intake: 8   predicted:  50 ?  ?COGNITION: ?11/06/2021: Overall cognitive status: Within functional limits for tasks assessed                  ?  ?PALPATION: ?11/06/2021: unable to palpate (cast on) ?  ?LE AROM/PROM: ?  ?A/PROM Right ?11/06/21 Left ?11/19/21 Left ?11/25/2021 ?Measured in seated knee 90 deg Left ?12/09/2021 ?Measured in seated knee 90 deg Left ?01/22/2022 ?Measured in seated knee 90 deg  ?Hip flexion         ?Hip extension         ?Hip abduction         ?Hip adduction         ?Hip internal rotation         ?Hip external rotation         ?Knee flexion         ?Knee extension         ?  Ankle dorsiflexion    A: 4* AROM 6 AROM 12 AROM 20  ?Ankle plantarflexion   A: 29*  AROM 40 AROM 40 WFL  ?Ankle inversion   A: 11*  AROM 14 AROM 26 AROM 26  ?Ankle eversion   A: 1*  AROM 20 AROM 22 AROM 28  ? (Blank rows = not tested) ?  ?LE MMT: ?  ?MMT Right ?11/06/21 Left ?11/06/21 Left ?11/25/2021 Left ?12/09/2021 Left ?12/18/2021 Left ?01/06/2022 Left ?01/22/2022  ?Hip flexion 5/5 4/5       ?Hip extension           ?Hip abduction           ?Hip adduction           ?Hip internal rotation           ?Hip external rotation           ?Knee flexion 5/5 4/5       ?Knee extension 5/5 4/5       ?Ankle dorsiflexion 5/5   5/5 5/5   5/5  ?Ankle plantarflexion     2+/5 2+/5 (unable to assess in WB) 2+/5 - unable to perform single leg heel raise 3/5 - able to clear for 1 rep 3/5 - able to clear for 1 rep SL PF  ?Ankle inversion 5/5   4/5 4/5   5/5  ?Ankle eversion 5/5   4/5 5/5   5/5  ? (Blank rows = not tested) ?  ?UE  MMT: ?MMT Right ?11/06/21 Left ?11/06/21  ?shoulder flexion 4+/5 5/5  ?shoulder extension      ?shoulder abduction 4/5 5/5  ?shoulder adduction      ?shoulder internal rotation 5/5 5/5  ?shoulder external rotation 4/5 5/5  ?elbow flexion 5/5 5/5  ?elbow extension 5/5 5/5  ?       ?       ?       ?       ?  ?  ?LOWER EXTREMITY SPECIAL TESTS:  ?11/06/2021: none performed today ?  ?FUNCTIONAL TESTS:  ? ?01/20/2022:  Lt SLS:  5 seconds ?11/06/2021: non performed today ?  ?GAIT: ?01/22/2022:  Ambulation independently in clinic, to and from.  ? ?12/16/2021: Ambulation without boot c quad cane in Rt UE ? ?12/04/2021:  Ambulation household distances in boot c quad cane in Rt UE c primarily step to gait pattern.  ? ?11/06/2021: Distance walked: 15 ft ?Assistive device utilized: FWW ?Level of assistance: CGA to Min A ?Comments: step to gait pattern c NWB on Lt leg ? ?TODAY'S TREATMENT: ?01/27/2022 ? ?Therex: UBE LE only lvl 3.5 10 mins  ?  Runner's stretch on incline board Lt leg posterior 30 sec x 3 ?  Double leg heel raise on incline board, Lt leg lowering eccentrically 2 x 10  ?  Flight of stairs reciprocal up/down c one rail assist.  ?   ?NeuroReed  ?Theraband rocker board fwd/back control 30x ?Tandem ambulation fwd/back on foam in // bars 8 ft x 8 each way ?  Lateral stepping over cones on foam 8 ft x 5 bilateral ?  Step up fwd on Bosu x 15 bilateral in // bars occasional hand assist required ? ?01/22/2022 ? ?Therex: Recumbent bike lvl 3 10 mins seat 5 ?  Incline board stretch 1 min x 3 ?  Double leg heel raise on incline board, Lt leg lowering eccentrically 3 x 10  ?  Single leg press 2 x 15  bilateral 56 lbs ?   ?   ?NeuroReed SLS on ground 30 sec x 4 bilateral occasional to moderate HHA required ( cues for home use to promote improved balance control) ?  Lateral stepping 3 cones x 6 bilateral ? ?01/20/2022 ? ?Therex: Recumbent bike lvl 4 10 mins seat 5 ?  Calf raise on leg press Lt leg 2 x 15 50 lbs slow eccentric focus ?  Single leg  press 2 x 15 bilateral 56 lbs ?  Incline board stretch 1 min x 3 ?  Standing PF c eccentric lowering majority of weight on Lt 2 x 15 ?   ?NeuroReed SLS on foam occasional hand touch on rail 30 sec x 3 bilateral ?  SLS c slider light touch fwd, lateral, back 2 x 10 each bilateral ?   ?01/15/2022 ? ?Therex: Recumbent bike lvl 3 10 mins seat 5 ?  Calf raise on leg press Lt leg 2 x 15 50 lbs slow eccentric focus ?  Single leg press 2 x 15 bilateral 50 lbs ?  Incline board stretch 1 min x 3 ?  Standing PF c eccentric lowering majority of weight on Lt 3 x10 ?   ?NeuroReed Lateral stepping 3 cones x 10 bilateral ?  SLS c slider light touch fwd, lateral, back x 10 each bilateral ?  Fitter rocker board fwd/back light taps 30x each way ? ? ? ?PATIENT EDUCATION:  ?01/22/2022:  ?Education details: HEP review ?Person educated: Patient ?Education method: Explanation, Demonstration, Verbal cues ?Education comprehension: verbalized understanding and returned demonstration ? ?  ?  ?HOME EXERCISE PROGRAM: ?Access Code: [email or web address] ?URL: [email or web address] ?Date: 12/16/2021 ?Prepared by: Scot Jun ? ?Exercises ?- Seated Straight Leg Heel Taps  - 1-2 x daily - 7 x weekly - 3 sets - 10 reps ?- Long Sitting Ankle Eversion with Resistance  - 2 x daily - 7 x weekly - 3 sets - 10 reps ?- Long Sitting Ankle Inversion with Resistance  - 2 x daily - 7 x weekly - 3 sets - 10 reps ?- Long Sitting Ankle Dorsiflexion with Anchored Resistance (Mirrored)  - 2 x daily - 7 x weekly - 3 sets - 10 reps ?- Standing Gastroc Stretch on Step  - 2 x daily - 7 x weekly - 1 sets - 5 reps - 30 sec hold ?- Single Leg Stance  - 1 x daily - 7 x weekly - 1 sets - 5 reps - 20-30 hold ?- Tandem Stance  - 1 x daily - 7 x weekly - 1 sets - 5 reps - 20-30 hold ?- Standing Heel Raise  - 1 x daily - 7 x weekly - 1-2 sets - 10 reps ? ?  ?  ?ASSESSMENT: ?  ?CLINICAL IMPRESSION: ?Continued work on improved balance control and PF strength performed today and  warranted to continue.  Last approved visit scheduled for this week.  Plan to review HEP, prepare for long term care with HEP vs in clinic activity.  ? ?  ?REHAB POTENTIAL: Good ?  ?CLINICAL DECISION MAKING: S

## 2022-01-29 ENCOUNTER — Encounter: Payer: Self-pay | Admitting: Rehabilitative and Restorative Service Providers"

## 2022-01-29 ENCOUNTER — Ambulatory Visit (INDEPENDENT_AMBULATORY_CARE_PROVIDER_SITE_OTHER): Payer: No Typology Code available for payment source | Admitting: Rehabilitative and Restorative Service Providers"

## 2022-01-29 DIAGNOSIS — R262 Difficulty in walking, not elsewhere classified: Secondary | ICD-10-CM | POA: Diagnosis not present

## 2022-01-29 DIAGNOSIS — M6281 Muscle weakness (generalized): Secondary | ICD-10-CM | POA: Diagnosis not present

## 2022-01-29 DIAGNOSIS — M25572 Pain in left ankle and joints of left foot: Secondary | ICD-10-CM

## 2022-01-29 NOTE — Therapy (Addendum)
OUTPATIENT PHYSICAL THERAPY TREATMENT NOTE [place] Patient Name: Christie Davis MRN: [id number] DOB:07/14/1946, 76 y.o., female Today's Date: 01/29/2022  PCP: Harlan Stains, MD REFERRING PROVIDER: Meredith Pel, MD    PT End of Session - 01/29/22 1435     Visit Number 23    Number of Visits 24    Date for PT Re-Evaluation 02/10/22    Authorization Type WC approved 12 visits    Authorization - Visit Number 12    Authorization - Number of Visits 12    Progress Note Due on Visit 24    PT Start Time 7357    PT Stop Time 1504    PT Time Calculation (min) 39 min    Activity Tolerance Patient tolerated treatment well    Behavior During Therapy WFL for tasks assessed/performed              Past Medical History:  Diagnosis Date   Chronic cough    off and on-   Hypertension    Medical history non-contributory    Wears glasses    Past Surgical History:  Procedure Laterality Date   COLONOSCOPY     ORIF HUMERUS FRACTURE Right 12/06/2012   Procedure: OPEN REDUCTION INTERNAL FIXATION (ORIF) PROXIMAL HUMERUS FRACTURE;  Surgeon: Nita Sells, MD;  Location: [place];  Service: Orthopedics;  Laterality: Right;   RECTAL TUMOR BY PROCTOTOMY EXCISION  2007   TUBAL LIGATION     There are no problems to display for this patient.   REFERRING DIAG: I97.847Q (ICD-10-CM) - Closed fracture of left ankle, initial encounter  ONSET DATE: 10/22/2021  THERAPY DIAG:  Pain in left ankle and joints of left foot  Muscle weakness (generalized)  Difficulty in walking, not elsewhere classified  PERTINENT HISTORY: HTN.  Injury 10/22/2021 with ED visit on 10/23/2021  PRECAUTIONS: None  SUBJECTIVE:  Pt indicated good reporting overall from MD visit.  Pt indicated no pain from weekend.  Pt indicated she was going to have to stay motivated for home program.  Pt indicated swelling still can occur.   PAIN:   Are you having pain? No NPRS scale:  No pain  indicated upon arrival.  Pain orientation: Lt ankle/calf Pain description: soreness Aggravating factors: nighttime, prolonged WB activity Relieving factors: rest  Patient Specific Functional Scale   - stairs:  01/22/2022:  8/10 01/06/2022:   7/10 (10 being no problem)   - walking     01/22/2022: 8/10 01/06/2022:   5/10  OBJECTIVE:  PATIENT SURVEYS:  01/15/2022: FOTO update: 67 % 12/09/2021: FOTO ankle update: 63% 11/06/2021: FOTO Ankle intake: 8   predicted:  50   COGNITION: 11/06/2021: Overall cognitive status: Within functional limits for tasks assessed                    PALPATION: 11/06/2021: unable to palpate (cast on)   LE AROM/PROM:   A/PROM Right 11/06/21 Left 11/19/21 Left 11/25/2021 Measured in seated knee 90 deg Left 12/09/2021 Measured in seated knee 90 deg Left 01/22/2022 Measured in seated knee 90 deg  Hip flexion         Hip extension         Hip abduction         Hip adduction         Hip internal rotation         Hip external rotation         Knee flexion  Knee extension         Ankle dorsiflexion    A: 4* AROM 6 AROM 12 AROM 20  Ankle plantarflexion   A: 29*  AROM 40 AROM 40 WFL  Ankle inversion   A: 11*  AROM 14 AROM 26 AROM 26  Ankle eversion   A: 1*  AROM 20 AROM 22 AROM 28   (Blank rows = not tested)   LE MMT:   MMT Right 11/06/21 Left 11/06/21 Left 11/25/2021 Left 12/09/2021 Left 12/18/2021 Left 01/06/2022 Left 01/22/2022   Hip flexion 5/5 4/5        Hip extension            Hip abduction            Hip adduction            Hip internal rotation            Hip external rotation            Knee flexion 5/5 4/5        Knee extension 5/5 4/5        Ankle dorsiflexion 5/5   5/5 5/5   5/5   Ankle plantarflexion     2+/5 2+/5 (unable to assess in WB) 2+/5 - unable to perform single leg heel raise 3/5 - able to clear for 1 rep 3/5 - able to clear for 1 rep SL PF 3/5 - able to clear for 1 rep SL PF  Ankle inversion 5/5   4/5 4/5   5/5   Ankle  eversion 5/5   4/5 5/5   5/5    (Blank rows = not tested)   UE MMT: MMT Right 11/06/21 Left 11/06/21  shoulder flexion 4+/5 5/5  shoulder extension      shoulder abduction 4/5 5/5  shoulder adduction      shoulder internal rotation 5/5 5/5  shoulder external rotation 4/5 5/5  elbow flexion 5/5 5/5  elbow extension 5/5 5/5                                  LOWER EXTREMITY SPECIAL TESTS:  11/06/2021: none performed today   FUNCTIONAL TESTS:   01/20/2022:  Lt SLS:  5 seconds 11/06/2021: non performed today   GAIT: 01/22/2022:  Ambulation independently in clinic, to and from.   12/16/2021: Ambulation without boot c quad cane in Rt UE  12/04/2021:  Ambulation household distances in boot c quad cane in Rt UE c primarily step to gait pattern.   11/06/2021: Distance walked: 15 ft Assistive device utilized: FWW Level of assistance: CGA to Min A Comments: step to gait pattern c NWB on Lt leg  TODAY'S TREATMENT: 01/29/2022  Therex: Recumbent bike lvl 3 10  mins   Runner's stretch on incline board Lt leg posterior 30 sec x 3   Double leg heel raise on incline board, Lt leg lowering eccentrically 2 x 10    Blue tband inversion, eversion 20x each slow controlled movement   Review of HEP c handout and verbal cues    NeuroReed  Retro step bilateral x 15 Tandem ambulation fwd/back on foam in // bars 8 ft x 12 each way   Lateral stepping over cones on foam 8 ft x 5 bilateral    01/27/2022  Therex: UBE LE only lvl 3.5 10 mins    Runner's stretch on incline board Lt leg posterior  30 sec x 3   Double leg heel raise on incline board, Lt leg lowering eccentrically 2 x 10    Flight of stairs reciprocal up/down c one rail assist.     NeuroReed  Theraband rocker board fwd/back control 30x Tandem ambulation fwd/back on foam in // bars 8 ft x 8 each way   Lateral stepping over cones on foam 8 ft x 5 bilateral   Step up fwd on Bosu x 15 bilateral in // bars occasional hand assist  required  01/22/2022  Therex: Recumbent bike lvl 3 10 mins seat 5   Incline board stretch 1 min x 3   Double leg heel raise on incline board, Lt leg lowering eccentrically 3 x 10    Single leg press 2 x 15 bilateral 56 lbs       NeuroReed SLS on ground 30 sec x 4 bilateral occasional to moderate HHA required ( cues for home use to promote improved balance control)   Lateral stepping 3 cones x 6 bilateral  01/20/2022  Therex: Recumbent bike lvl 4 10 mins seat 5   Calf raise on leg press Lt leg 2 x 15 50 lbs slow eccentric focus   Single leg press 2 x 15 bilateral 56 lbs   Incline board stretch 1 min x 3   Standing PF c eccentric lowering majority of weight on Lt 2 x 15    NeuroReed SLS on foam occasional hand touch on rail 30 sec x 3 bilateral   SLS c slider light touch fwd, lateral, back 2 x 10 each bilateral  PATIENT EDUCATION:  01/29/2022:  Education details: HEP review Person educated: Patient Education method: Consulting civil engineer, Media planner, Verbal cues Education comprehension: verbalized understanding and returned demonstration      HOME EXERCISE PROGRAM: 01/29/2022 Access Code: [email or web address] URL: [email or web address] Date: 01/29/2022 Prepared by: Scot Jun  Exercises - Seated Straight Leg Heel Taps  - 1-2 x daily - 7 x weekly - 3 sets - 10 reps - Long Sitting Ankle Eversion with Resistance  - 2 x daily - 7 x weekly - 3 sets - 10 reps - Long Sitting Ankle Inversion with Resistance  - 2 x daily - 7 x weekly - 3 sets - 10 reps - Single Leg Stance  - 1 x daily - 7 x weekly - 1 sets - 5 reps - 20-30 hold - Tandem Stance  - 1 x daily - 7 x weekly - 1 sets - 5 reps - 20-30 hold - Standing Heel Raise  - 1-2 x daily - 7 x weekly - 2-3 sets - 10 reps - Gastroc Stretch on Wall  - 1-2 x daily - 7 x weekly - 1 sets - 5 reps - 30 hold - Retro Step  - 1-2 x daily - 7 x weekly - 1 sets - 10-20 reps      ASSESSMENT:   CLINICAL IMPRESSION: Pt has attended 23 visits  overall to this point in treatment cycle.  Reduced pain complaints noted with main complaints noted at night but marked reduction compared to previous.  See objective data for updated information.  Progression to independent ambulation noted at this time with good technique overall.  Pt has demonstrated gains in all areas to this point.  Presentation of Lt ankle PF strength deficit still noted compared but addressed in HEP going forward.  Good knowledge of HEP noted.  Trial of HEP indicated at this time c goal of long term gains with continued HEP  use.     REHAB POTENTIAL: Good   CLINICAL DECISION MAKING: Stable/uncomplicated   EVALUATION COMPLEXITY: Low   GOALS: Goals reviewed with patient? Yes   SHORT TERM GOALS:   STG Name Target Date Goal status  1 Patient will demonstrate independent use of home exercise program to maintain progress from in clinic treatments.  11/27/2021 MET Assessed 11/25/2021  2 Patient will demonstrate modified independent ambulation c LRAD community distances (pending WB changes) 11/27/2021 Unable to reach goal prior to target date due to [facility] restrictions   3  Patient will demonstrate safe ascending/descending stairs c use of assistive device and rails to facilitate household navigation.  11/27/2021 MET                                         LONG TERM GOALS:    LTG Name Target Date Goal status  1 Patient will demonstrate/report pain at worst less than or equal to 2/10 to facilitate minimal limitation in daily activity secondary to pain symptoms.  02/10/2022 Mostly Met Assessed 01/29/2022  2 Patient will demonstrate independent use of home exercise program to facilitate ability to maintain/progress functional gains from skilled physical therapy services.  02/10/2022 MET 01/22/2022  3 Patient will demonstrate FOTO outcome > or = 50% to indicated reduced disability due to condition. 02/10/2022 MET  4 Patient will demonstrate Lt knee MMT 5/5, ankle 5/5 throughout to  facilitate ability to perform usual standing, walking, stairs at PLOF s limitation due to symptoms. 02/10/2022 Partially met - on going 01/29/2022  5 Patient will demonstrate independent ambulation community distances > 300 ft to return to normal (pending WB restrictions) 02/10/2022 MET 01/08/2022  6 Patient will demonstrate reciprocal gait pattern up/down stairs for household navigation.  02/10/2022 MET 01/22/2022         PLAN: PT FREQUENCY: 2x/week   PT DURATION: 8 weeks     PLANNED INTERVENTIONS: Therapeutic exercises, Therapeutic activity, Neuro Muscular re-education, Balance training, Gait training, Patient/Family education, Joint mobilization, Stair training, DME instructions, Dry Needling, Electrical stimulation, Cryotherapy, Moist heat, Taping, Ultrasound, Ionotophoresis 9m/ml Dexamethasone, and Manual therapy.  All included unless contraindicated   PLAN FOR NEXT SESSION:  Hold PT with transitioning to HEP.  Return if necessary but additional approved visits required.   *No localized edema/vaso code in [place] PT, DPT, OCS, ATC 01/29/22  3:01 PM  PHYSICAL THERAPY DISCHARGE SUMMARY  Visits from Start of Care: 23  Current functional level related to goals / functional outcomes: See note   Remaining deficits: See note   Education / Equipment: HEP   Patient goals were partially met. Patient is being discharged due to not returning since the last visit.  MScot Jun PT, DPT, OCS, ATC 03/03/22  2:58 PM

## 2022-02-20 ENCOUNTER — Ambulatory Visit
Admission: RE | Admit: 2022-02-20 | Discharge: 2022-02-20 | Disposition: A | Discharge status: Home / Self Care | Payer: Medicare HMO | Source: Ambulatory Visit | Attending: Family Medicine | Admitting: Family Medicine

## 2022-02-20 DIAGNOSIS — R928 Other abnormal and inconclusive findings on diagnostic imaging of breast: Secondary | ICD-10-CM | POA: Diagnosis not present

## 2022-03-04 DIAGNOSIS — Z6833 Body mass index (BMI) 33.0-33.9, adult: Secondary | ICD-10-CM | POA: Diagnosis not present

## 2022-03-04 DIAGNOSIS — E559 Vitamin D deficiency, unspecified: Secondary | ICD-10-CM | POA: Diagnosis not present

## 2022-03-04 DIAGNOSIS — Z9181 History of falling: Secondary | ICD-10-CM | POA: Diagnosis not present

## 2022-03-04 DIAGNOSIS — H409 Unspecified glaucoma: Secondary | ICD-10-CM | POA: Diagnosis not present

## 2022-03-04 DIAGNOSIS — Z008 Encounter for other general examination: Secondary | ICD-10-CM | POA: Diagnosis not present

## 2022-03-04 DIAGNOSIS — I10 Essential (primary) hypertension: Secondary | ICD-10-CM | POA: Diagnosis not present

## 2022-03-04 DIAGNOSIS — Z833 Family history of diabetes mellitus: Secondary | ICD-10-CM | POA: Diagnosis not present

## 2022-03-04 DIAGNOSIS — Z8249 Family history of ischemic heart disease and other diseases of the circulatory system: Secondary | ICD-10-CM | POA: Diagnosis not present

## 2022-03-04 DIAGNOSIS — E669 Obesity, unspecified: Secondary | ICD-10-CM | POA: Diagnosis not present

## 2022-03-26 ENCOUNTER — Ambulatory Visit (INDEPENDENT_AMBULATORY_CARE_PROVIDER_SITE_OTHER): Payer: No Typology Code available for payment source | Admitting: Surgical

## 2022-03-26 ENCOUNTER — Ambulatory Visit (INDEPENDENT_AMBULATORY_CARE_PROVIDER_SITE_OTHER): Payer: No Typology Code available for payment source

## 2022-03-26 DIAGNOSIS — S82892A Other fracture of left lower leg, initial encounter for closed fracture: Secondary | ICD-10-CM | POA: Diagnosis not present

## 2022-04-02 ENCOUNTER — Encounter: Payer: Self-pay | Admitting: Orthopedic Surgery

## 2022-04-02 NOTE — Progress Notes (Signed)
Office Visit Note   Patient: Christie Davis           Date of Birth: 06/18/1946           MRN: [id number] Visit Date: 03/26/2022 Requested by: Harlan Stains, MD [address],  [place] [address] PCP: Harlan Stains, MD  Subjective: Chief Complaint  Patient presents with   Left Ankle - Follow-up    HPI: Christie Davis is a 76 y.o. female who presents to the office complaining of left ankle pain and stiffness.  Patient returns following left ankle fracture.  She has been in with physical therapy which was very helpful for her.  She notes occasional swelling in the ankle with extended activity.  Little bit of pain in the lateral aspect of the ankle.  Overall she is continuing to trend better out from her injury.  She ambulates with a regular shoe.  Does not use ankle brace or boot..                ROS: All systems reviewed are negative as they relate to the chief complaint within the history of present illness.  Patient denies fevers or chills.  Assessment & Plan: Visit Diagnoses:  1. Closed fracture of left ankle, initial encounter     Plan: Patient is a 76 year old female who presents for evaluation of left ankle fracture.  She is several months out from the initial injury and has continued to improve.  She is here today because she would like to be set up for physical therapy again which was helpful for her.  She does note some occasional swelling and stiffness in the ankle with extended activity.  Assured her that this is normal and this can persist for 6 to 9 months after the initial injury.  Radiographs today show well-healed ankle fracture of the lateral malleolus.  No new findings on radiographs today.  Plan to follow-up for final check in 3 months.  Follow-Up Instructions: No follow-ups on file.   Orders:  Orders Placed This Encounter  Procedures   XR Ankle Complete Left   Ambulatory referral to Physical Therapy   No orders of the defined  types were placed in this encounter.     Procedures: No procedures performed   Clinical Data: No additional findings.  Objective: Vital Signs: There were no vitals taken for this visit.  Physical Exam:  Constitutional: Patient appears well-developed HEENT:  Head: Normocephalic Eyes:EOM are normal Neck: Normal range of motion Cardiovascular: Normal rate Pulmonary/chest: Effort normal Neurologic: Patient is alert Skin: Skin is warm Psychiatric: Patient has normal mood and affect  Ortho Exam: Ortho exam demonstrates left ankle with intact dorsiflexion, plantarflexion, inversion, eversion.  2+ DP pulse of the left lower extremity.  No calf tenderness.  Negative Homans' sign.  No tenderness over the lateral malleolus, medial malleolus, Lisfranc complex, with metatarsal base, Achilles tendon, Achilles tendon insertion, ATFL, CFL.  Specialty Comments:  No specialty comments available.  Imaging: No results found.   PMFS History: There are no problems to display for this patient.  Past Medical History:  Diagnosis Date   Chronic cough    off and on-   Hypertension    Medical history non-contributory    Wears glasses     No family history on file.  Past Surgical History:  Procedure Laterality Date   COLONOSCOPY     ORIF HUMERUS FRACTURE Right 12/06/2012   Procedure: OPEN REDUCTION INTERNAL FIXATION (ORIF) PROXIMAL HUMERUS  FRACTURE;  Surgeon: Nita Sells, MD;  Location: [place];  Service: Orthopedics;  Laterality: Right;   RECTAL TUMOR BY PROCTOTOMY EXCISION  2007   TUBAL LIGATION     Social History   Occupational History   Not on file  Tobacco Use   Smoking status: Never   Smokeless tobacco: Never  Substance and Sexual Activity   Alcohol use: No   Drug use: No   Sexual activity: Not on file

## 2022-04-03 NOTE — Therapy (Signed)
OUTPATIENT PHYSICAL THERAPY LOWER EXTREMITY EVALUATION   Patient Name: Christie Davis MRN: [id number] DOB:[birth year], 76 y.o., female Today's Date: 04/04/2022   PT End of Session - 04/04/22 1058     Visit Number 1    Number of Visits 6    Date for PT Re-Evaluation 05/16/22    Authorization Type WC 6 visits    Authorization - Number of Visits 1    Progress Note Due on Visit 6    PT Start Time 1021    PT Stop Time 1058    PT Time Calculation (min) 37 min    Activity Tolerance Patient tolerated treatment well    Behavior During Therapy WFL for tasks assessed/performed             Past Medical History:  Diagnosis Date   Chronic cough    off and on-   Hypertension    Medical history non-contributory    Wears glasses    Past Surgical History:  Procedure Laterality Date   COLONOSCOPY     ORIF HUMERUS FRACTURE Right 12/06/2012   Procedure: OPEN REDUCTION INTERNAL FIXATION (ORIF) PROXIMAL HUMERUS FRACTURE;  Surgeon: Nita Sells, MD;  Location: [place];  Service: Orthopedics;  Laterality: Right;   RECTAL TUMOR BY PROCTOTOMY EXCISION  2007   TUBAL LIGATION     There are no problems to display for this patient.   PCP: Harlan Stains, MD   REFERRING PROVIDER: Rosalin Hawking   REFERRING DIAG: [phone] (ICD-10-CM) - Closed fracture of left ankle, initial encounter   THERAPY DIAG:  Pain in left ankle and joints of left foot   Muscle weakness (generalized)   Difficulty in walking, not elsewhere classified   ONSET DATE: 10/22/2021   SUBJECTIVE:    SUBJECTIVE STATEMENT: Pt indicated having xrays from MD office which showed things were healing.   Pt indicated pain on outside of Lt foot/ankle and favoring Rt.  Pt indicated she just isn't back to normal.     PERTINENT HISTORY: HTN.  Injury 10/22/2021 with ED visit on 10/23/2021.  Treatment cycle for condition at clinic previously.    PAIN:  NPRS scale: 7/10 at worst, 0/10 at  best Pain location: ankle/foot Pain orientation: Left  Pain description: aching, burning Aggravating factors: activity increase during day (walking, standing) Relieving factors: tylenol   PRECAUTIONS: none   WEIGHT BEARING RESTRICTIONS None   FALLS:  Has patient fallen in last 6 months? Yes, Number of falls: 1     LIVING ENVIRONMENT: Lives with: lives with their family Lives in: House/apartment Stairs: 2 story house (bedroom on 2nd floor) with rails on both sides before landing then 1 rail for second part.  4 stairs to enter with bilateral hand rails reported (maybe able to touch).  Scooting on bottom.  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   PLOF: Independent, walking in neighborhood, housework    PATIENT GOALS Reduce pain, get back to normal routine     OBJECTIVE:        PATIENT SURVEYS:  04/04/2022: FOTO Ankle intake:     COGNITION: 04/04/2022: Overall cognitive status: Within functional limits for tasks assessed                    PALPATION: 04/04/2022: tenderness at posterior to lateral malleolus and fibularis brevis tendon attachment   LE AROM/PROM:  AROM  Seated with 90* knee flexion Right 04/04/2022 Left 04/04/2022  Hip flexion      Hip extension      Hip abduction      Hip adduction      Hip internal rotation      Hip external rotation      Knee flexion      Knee extension      Ankle dorsiflexion 14* 10*  Ankle plantarflexion      Ankle inversion  35* 30*  Ankle eversion  15* 20*   (Blank rows = not tested)   LE MMT:   MMT Right 04/04/2022 Left 04/04/2022  Hip flexion 5/5 4/5  Hip extension      Hip abduction      Hip adduction      Hip internal rotation      Hip external rotation      Knee flexion 5/5 5/5  Knee extension 5/5 5/5  Ankle dorsiflexion 5/5 24, 23 lbs 25.9, 25.2 lbs  Ankle plantarflexion  3-/5 3-/5    Ankle inversion 5/5 12.3, 12.4 lbs 13.7, 15.2 lbs  Ankle eversion 5/5 27.4, 25.2 lbs  19.2, 21 lbs   (Blank rows = not tested)     LOWER EXTREMITY SPECIAL TESTS:  04/04/2022: none performed today   FUNCTIONAL TESTS:  04/04/2022: Lt SLS:  < 3 seconds, Rt SLS < 3 seconds   GAIT: 04/04/2022:  Distance walked: independent ambulation c mild limitation in DF toe off progression.         TODAY'S TREATMENT: 04/04/2022: Therex:  HEP instruction/performance c cues for techniques, handout provided.  Trial set performed of each for comprehension and symptom assessment.  See below for exercise list.     PATIENT EDUCATION:  04/04/2022:  Education details: HEP, POC Person educated: Patient Education method: Consulting civil engineer, Demonstration, Verbal cues, and Handouts Education comprehension: verbalized understanding and returned demonstration     HOME EXERCISE PROGRAM: 04/04/2022:  Access Code: [email or web address] URL: [email or web address] Date: 04/04/2022 Prepared by: Scot Jun  Exercises - Long Sitting Ankle Eversion with Resistance  - 2 x daily - 7 x weekly - 3 sets - 10 reps - Long Sitting Ankle Inversion with Resistance  - 2 x daily - 7 x weekly - 3 sets - 10 reps - Single Leg Stance  - 1 x daily - 7 x weekly - 1 sets - 5 reps - 20-30 hold - Tandem Stance  - 1 x daily - 7 x weekly - 1 sets - 5 reps - 20-30 hold - Standing Heel Raise  - 1-2 x daily - 7 x weekly - 2-3 sets - 10 reps - Gastroc Stretch on Wall  - 1-2 x daily - 7 x weekly - 1 sets - 5 reps - 30 hold - Standing Dorsiflexion Self-Mobilization on Step  - 2 x daily - 7 x weekly - 3 sets - 10-15 reps - 1-2 hold      ASSESSMENT:   CLINICAL IMPRESSION: Patient is a 76 y.o. who comes to clinic with complaints of Lt ankle pain s/p closed fracture with mobility, strength and movement coordination deficits that impair their ability to perform usual daily and recreational functional activities without increase  difficulty/symptoms at this time.  Patient to benefit from skilled PT services to address impairments and limitations to improve to previous level of function without restriction secondary to condition.   Objective impairments include Abnormal gait, decreased activity tolerance, decreased balance, decreased coordination, decreased endurance, decreased mobility, difficulty walking, decreased  ROM, decreased strength, hypomobility, increased edema, impaired flexibility, improper body mechanics, postural dysfunction, and pain. These impairments are limiting patient from cleaning, community activity, driving, meal prep, occupation, laundry, and shopping.  Patient will benefit from skilled PT to address above impairments and improve overall function.   REHAB POTENTIAL: Good   CLINICAL DECISION MAKING: Stable/uncomplicated   EVALUATION COMPLEXITY: Low     GOALS: Goals reviewed with patient? Yes   SHORT TERM GOALS:   STG Name Target Date Goal status  1 Patient will demonstrate independent use of home exercise program to maintain progress from in clinic treatments.  04/18/2022 INITIAL                                                  LONG TERM GOALS:    LTG Name Target Date Goal status  1 Patient will demonstrate/report pain at worst less than or equal to 2/10 to facilitate minimal limitation in daily activity secondary to pain symptoms.  05/16/2022 INITIAL  2 Patient will demonstrate independent use of home exercise program to facilitate ability to maintain/progress functional gains from skilled physical therapy services.  05/16/2022 INITIAL       3 Patient will demonstrate dynamometry within 10 %, ankle 5/5 throughout to facilitate ability to perform usual standing, walking, stairs at PLOF s limitation due to symptoms. 05/16/2022 INITIAL  4 Patient will demonstrate independent ambulation community distances > 300 ft without deviation.  05/16/2022 INITIAL  5 Patient will demonstrate bilateral SLS  > 15 seconds to facilitate stability in ambulation.  05/16/2022 INITIAL           PLAN: PT FREQUENCY: 1-2x/week   PT DURATION: 6 weeks   (WORKERS COMP approved 6 visits total)   PLANNED INTERVENTIONS: Therapeutic exercises, Therapeutic activity, Neuro Muscular re-education, Balance training, Gait training, Patient/Family education, Joint mobilization, Stair training, DME instructions, Dry Needling, Electrical stimulation, Cryotherapy, Moist heat, Taping, Ultrasound, Ionotophoresis '4mg'$ /ml Dexamethasone, and Manual therapy.  All included unless contraindicated   PLAN FOR NEXT SESSION: compliant surface balance improvements.   Scot Jun, PT, DPT, OCS, ATC 04/04/22  11:04 AM

## 2022-04-04 ENCOUNTER — Encounter: Payer: Self-pay | Admitting: Rehabilitative and Restorative Service Providers"

## 2022-04-04 ENCOUNTER — Ambulatory Visit (INDEPENDENT_AMBULATORY_CARE_PROVIDER_SITE_OTHER): Payer: Medicare HMO | Admitting: Rehabilitative and Restorative Service Providers"

## 2022-04-04 DIAGNOSIS — R262 Difficulty in walking, not elsewhere classified: Secondary | ICD-10-CM | POA: Diagnosis not present

## 2022-04-04 DIAGNOSIS — H401122 Primary open-angle glaucoma, left eye, moderate stage: Secondary | ICD-10-CM | POA: Diagnosis not present

## 2022-04-04 DIAGNOSIS — M6281 Muscle weakness (generalized): Secondary | ICD-10-CM

## 2022-04-04 DIAGNOSIS — H2513 Age-related nuclear cataract, bilateral: Secondary | ICD-10-CM | POA: Diagnosis not present

## 2022-04-04 DIAGNOSIS — H401111 Primary open-angle glaucoma, right eye, mild stage: Secondary | ICD-10-CM | POA: Diagnosis not present

## 2022-04-04 DIAGNOSIS — M25572 Pain in left ankle and joints of left foot: Secondary | ICD-10-CM | POA: Diagnosis not present

## 2022-04-08 ENCOUNTER — Encounter: Payer: Self-pay | Admitting: Rehabilitative and Restorative Service Providers"

## 2022-04-08 ENCOUNTER — Ambulatory Visit (INDEPENDENT_AMBULATORY_CARE_PROVIDER_SITE_OTHER): Payer: No Typology Code available for payment source | Admitting: Rehabilitative and Restorative Service Providers"

## 2022-04-08 DIAGNOSIS — M6281 Muscle weakness (generalized): Secondary | ICD-10-CM

## 2022-04-08 DIAGNOSIS — M25572 Pain in left ankle and joints of left foot: Secondary | ICD-10-CM | POA: Diagnosis not present

## 2022-04-08 DIAGNOSIS — Z0184 Encounter for antibody response examination: Secondary | ICD-10-CM | POA: Diagnosis not present

## 2022-04-08 DIAGNOSIS — R262 Difficulty in walking, not elsewhere classified: Secondary | ICD-10-CM | POA: Diagnosis not present

## 2022-04-08 NOTE — Therapy (Signed)
OUTPATIENT PHYSICAL THERAPY TREATMENT NOTE   Patient Name: Christie Davis MRN: [id number] DOB:06-Apr-1946, 76 y.o., female Today's Date: 04/08/2022  PCP: Harlan Stains, MD   REFERRING PROVIDER: Donella Stade, PA-C  END OF SESSION:   PT End of Session - 04/08/22 0915     Visit Number 2    Number of Visits 6    Date for PT Re-Evaluation 05/16/22    Authorization Type WC 6 visits    Authorization - Number of Visits 2    Progress Note Due on Visit 6    PT Start Time 0842    PT Stop Time 0922    PT Time Calculation (min) 40 min    Activity Tolerance Patient tolerated treatment well    Behavior During Therapy [facility] for tasks assessed/performed             Past Medical History:  Diagnosis Date   Chronic cough    off and on-   Hypertension    Medical history non-contributory    Wears glasses    Past Surgical History:  Procedure Laterality Date   COLONOSCOPY     ORIF HUMERUS FRACTURE Right 12/06/2012   Procedure: OPEN REDUCTION INTERNAL FIXATION (ORIF) PROXIMAL HUMERUS FRACTURE;  Surgeon: Nita Sells, MD;  Location: [place];  Service: Orthopedics;  Laterality: Right;   RECTAL TUMOR BY PROCTOTOMY EXCISION  2007   TUBAL LIGATION     There are no problems to display for this patient.   REFERRING DIAG: X90.240X (ICD-10-CM) - Closed fracture of left ankle, initial encounter  ONSET DATE: 10/22/2021  THERAPY DIAG:  Pain in left ankle and joints of left foot  Muscle weakness (generalized)  Difficulty in walking, not elsewhere classified  Rationale for Evaluation and Treatment Rehabilitation  SUBJECTIVE:    SUBJECTIVE STATEMENT: Pt indicated a little tingle here and there on foot.  Pt indicated she thought the exercises helped some.    PERTINENT HISTORY: HTN.  Injury 10/22/2021 with ED visit on 10/23/2021.  Treatment cycle for condition at clinic previously.    PAIN:  NPRS scale: 0/10 upon arrival at rest Pain location:  ankle/foot Pain orientation: Left  Pain description: aching, burning Aggravating factors: activity increase during day (walking, standing) Relieving factors: tylenol   PRECAUTIONS: none   WEIGHT BEARING RESTRICTIONS None   FALLS:  Has patient fallen in last 6 months? Yes, Number of falls: 1     LIVING ENVIRONMENT: Lives with: lives with their family Lives in: House/apartment Stairs: 2 story house (bedroom on 2nd floor) with rails on both sides before landing then 1 rail for second part.  4 stairs to enter with bilateral hand rails reported (maybe able to touch).  Scooting on bottom.  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   PLOF: Independent, walking in neighborhood, housework    PATIENT GOALS Reduce pain, get back to normal routine     OBJECTIVE:      PATIENT SURVEYS:  04/04/2022: FOTO Ankle intake not gathered by front desk on evaluation.    COGNITION: 04/04/2022: Overall cognitive status: Within functional limits for tasks assessed                    PALPATION: 04/04/2022: tenderness at posterior to lateral malleolus and fibularis brevis tendon attachment   LE AROM/PROM:   AROM  Seated with 90* knee flexion Right  04/04/2022 Left 04/04/2022  Hip flexion      Hip extension      Hip abduction      Hip adduction      Hip internal rotation      Hip external rotation      Knee flexion      Knee extension      Ankle dorsiflexion 14* 10*  Ankle plantarflexion      Ankle inversion  35* 30*  Ankle eversion  15* 20*   (Blank rows = not tested)   LE MMT:   MMT Right 04/04/2022 Left 04/04/2022  Hip flexion 5/5 4/5  Hip extension      Hip abduction      Hip adduction      Hip internal rotation      Hip external rotation      Knee flexion 5/5 5/5  Knee extension 5/5 5/5  Ankle dorsiflexion 5/5 24, 23 lbs 25.9, 25.2 lbs  Ankle plantarflexion   3-/5 3-/5   Ankle inversion 5/5 12.3, 12.4 lbs 13.7, 15.2 lbs  Ankle eversion 5/5 27.4, 25.2 lbs  19.2, 21 lbs   (Blank rows = not tested)     LOWER EXTREMITY SPECIAL TESTS:  04/04/2022: none performed today   FUNCTIONAL TESTS:  04/04/2022: Lt SLS:  < 3 seconds, Rt SLS < 3 seconds   GAIT: 04/04/2022:  Distance walked: independent ambulation c mild limitation in DF toe off progression.          TODAY'S TREATMENT: 04/08/2022: Therex:  Recumbent bike lvl 2 6 mins  6 inch step on over and down x 15, performed bilaterally  Standing PF c eccentric lowering on Lt focus (majority of weight) 2 x 10 , bilateral   Neuro Re-ed  Feet together on foam eyes closed 60 seconds, eyes open c head turn Lt then Rt focus 30 sec each  Feet together [employer] pew anterior/posterior weight shift 2 mins  SLS on foam c contralateral cone touch (anterior, anterior/lateral, anterior/medial) x 6 bilateral c close SBA/CGA  SLS c vector reach fwd/lateral/side c contralateral leg light touch x 8 bilateral  Fitter board rocker fwd/back light touch to ground control focus x30 occassional hand assist, SBA  04/04/2022: Therex:  HEP instruction/performance c cues for techniques, handout provided.  Trial set performed of each for comprehension and symptom assessment.  See below for exercise list.     PATIENT EDUCATION:  04/04/2022:  Education details: HEP, POC Person educated: Patient Education method: Consulting civil engineer, Demonstration, Verbal cues, and Handouts Education comprehension: verbalized understanding and returned demonstration     HOME EXERCISE PROGRAM: 04/04/2022:  Access Code: [email or web address] URL: [email or web address] Date: 04/04/2022 Prepared by: Scot Jun   Exercises - Long Sitting Ankle Eversion with Resistance  - 2 x daily - 7 x weekly - 3 sets - 10 reps - Long Sitting Ankle Inversion with Resistance  - 2 x daily - 7 x weekly - 3 sets - 10 reps - Single Leg Stance  - 1 x daily - 7 x  weekly - 1 sets - 5 reps - 20-30 hold - Tandem Stance  - 1 x daily - 7 x weekly - 1 sets - 5 reps - 20-30 hold - Standing Heel Raise  - 1-2 x daily - 7 x weekly - 2-3 sets - 10 reps - Gastroc Stretch on Wall  - 1-2 x daily - 7 x weekly - 1 sets - 5 reps - 30 hold - Standing Dorsiflexion Self-Mobilization on Step  -  2 x daily - 7 x weekly - 3 sets - 10-15 reps - 1-2 hold       ASSESSMENT:   CLINICAL IMPRESSION:  Fair control on compliant surface balance interventions today.  Improvement in these tasks will aid and improve functional movement control in community environment on even and uneven surfaces.  Progressive strengthening also to benefit Pt as well.      Objective impairments include Abnormal gait, decreased activity tolerance, decreased balance, decreased coordination, decreased endurance, decreased mobility, difficulty walking, decreased ROM, decreased strength, hypomobility, increased edema, impaired flexibility, improper body mechanics, postural dysfunction, and pain. These impairments are limiting patient from cleaning, community activity, driving, meal prep, occupation, laundry, and shopping.  Patient will benefit from skilled PT to address above impairments and improve overall function.   REHAB POTENTIAL: Good   CLINICAL DECISION MAKING: Stable/uncomplicated   EVALUATION COMPLEXITY: Low     GOALS: Goals reviewed with patient? Yes   SHORT TERM GOALS:   STG Name Target Date Goal status  1 Patient will demonstrate independent use of home exercise program to maintain progress from in clinic treatments.  04/18/2022 On going 04/08/2022                                                          LONG TERM GOALS:    LTG Name Target Date Goal status  1 Patient will demonstrate/report pain at worst less than or equal to 2/10 to facilitate minimal limitation in daily activity secondary to pain symptoms.  05/16/2022 INITIAL  2 Patient will demonstrate independent use of home  exercise program to facilitate ability to maintain/progress functional gains from skilled physical therapy services.  05/16/2022 INITIAL           3 Patient will demonstrate dynamometry within 10 %, ankle 5/5 throughout to facilitate ability to perform usual standing, walking, stairs at PLOF s limitation due to symptoms. 05/16/2022 INITIAL  4 Patient will demonstrate independent ambulation community distances > 300 ft without deviation.  05/16/2022 INITIAL  5 Patient will demonstrate bilateral SLS > 15 seconds to facilitate stability in ambulation.  05/16/2022 INITIAL             PLAN: PT FREQUENCY: 1-2x/week   PT DURATION: 6 weeks   (WORKERS COMP approved 6 visits total)   PLANNED INTERVENTIONS: Therapeutic exercises, Therapeutic activity, Neuro Muscular re-education, Balance training, Gait training, Patient/Family education, Joint mobilization, Stair training, DME instructions, Dry Needling, Electrical stimulation, Cryotherapy, Moist heat, Taping, Ultrasound, Ionotophoresis '4mg'$ /ml Dexamethasone, and Manual therapy.  All included unless contraindicated   PLAN FOR NEXT SESSION: compliant surface balance improvements, progressive strengthening.  Scot Jun, PT, DPT, OCS, ATC 04/08/22  9:18 AM

## 2022-04-10 ENCOUNTER — Encounter: Payer: Self-pay | Admitting: Rehabilitative and Restorative Service Providers"

## 2022-04-10 ENCOUNTER — Ambulatory Visit (INDEPENDENT_AMBULATORY_CARE_PROVIDER_SITE_OTHER): Payer: No Typology Code available for payment source | Admitting: Rehabilitative and Restorative Service Providers"

## 2022-04-10 DIAGNOSIS — R262 Difficulty in walking, not elsewhere classified: Secondary | ICD-10-CM

## 2022-04-10 DIAGNOSIS — M6281 Muscle weakness (generalized): Secondary | ICD-10-CM | POA: Diagnosis not present

## 2022-04-10 DIAGNOSIS — M25572 Pain in left ankle and joints of left foot: Secondary | ICD-10-CM

## 2022-04-10 NOTE — Therapy (Signed)
OUTPATIENT PHYSICAL THERAPY TREATMENT NOTE   Patient Name: Christie Davis MRN: [id number] DOB:03-12-1946, 76 y.o., female Today's Date: 04/10/2022  PCP: Harlan Stains, MD   REFERRING PROVIDER: Donella Stade, PA-C  END OF SESSION:   PT End of Session - 04/10/22 1014     Visit Number 3    Number of Visits 6    Date for PT Re-Evaluation 05/16/22    Authorization Type WC 6 visits    Authorization - Number of Visits 2    Progress Note Due on Visit 6    PT Start Time 1012    PT Stop Time 1052    PT Time Calculation (min) 40 min    Activity Tolerance Patient tolerated treatment well    Behavior During Therapy WFL for tasks assessed/performed              Past Medical History:  Diagnosis Date   Chronic cough    off and on-   Hypertension    Medical history non-contributory    Wears glasses    Past Surgical History:  Procedure Laterality Date   COLONOSCOPY     ORIF HUMERUS FRACTURE Right 12/06/2012   Procedure: OPEN REDUCTION INTERNAL FIXATION (ORIF) PROXIMAL HUMERUS FRACTURE;  Surgeon: Nita Sells, MD;  Location: [facility];  Service: Orthopedics;  Laterality: Right;   RECTAL TUMOR BY PROCTOTOMY EXCISION  2007   TUBAL LIGATION     There are no problems to display for this patient.   REFERRING DIAG: J50.093G (ICD-10-CM) - Closed fracture of left ankle, initial encounter  ONSET DATE: 10/22/2021  THERAPY DIAG:  Pain in left ankle and joints of left foot  Muscle weakness (generalized)  Difficulty in walking, not elsewhere classified  Rationale for Evaluation and Treatment Rehabilitation  SUBJECTIVE:    SUBJECTIVE STATEMENT: Pt indicated a little tingle here and there on foot.  Pt indicated she thought the exercises helped some.    PERTINENT HISTORY: HTN.  Injury 10/22/2021 with ED visit on 10/23/2021.  Treatment cycle for condition at clinic previously.    PAIN:  NPRS scale: 0/10 upon arrival at rest Pain location:  ankle/foot Pain orientation: Left  Pain description: aching, burning Aggravating factors: activity increase during day (walking, standing) Relieving factors: tylenol   PRECAUTIONS: none   WEIGHT BEARING RESTRICTIONS None   FALLS:  Has patient fallen in last 6 months? Yes, Number of falls: 1     LIVING ENVIRONMENT: Lives with: lives with their family Lives in: House/apartment Stairs: 2 story house (bedroom on 2nd floor) with rails on both sides before landing then 1 rail for second part.  4 stairs to enter with bilateral hand rails reported (maybe able to touch).  Scooting on bottom.  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   PLOF: Independent, walking in neighborhood, housework    PATIENT GOALS Reduce pain, get back to normal routine     OBJECTIVE:      PATIENT SURVEYS:  04/04/2022: FOTO Ankle intake not gathered by front desk on evaluation.    COGNITION: 04/04/2022: Overall cognitive status: Within functional limits for tasks assessed                    PALPATION: 04/04/2022: tenderness at posterior to lateral malleolus and fibularis brevis tendon attachment   LE AROM/PROM:   AROM  Seated with 90* knee flexion  Right 04/04/2022 Left 04/04/2022  Hip flexion      Hip extension      Hip abduction      Hip adduction      Hip internal rotation      Hip external rotation      Knee flexion      Knee extension      Ankle dorsiflexion 14* 10*  Ankle plantarflexion      Ankle inversion  35* 30*  Ankle eversion  15* 20*   (Blank rows = not tested)   LE MMT:   MMT Right 04/04/2022 Left 04/04/2022  Hip flexion 5/5 4/5  Hip extension      Hip abduction      Hip adduction      Hip internal rotation      Hip external rotation      Knee flexion 5/5 5/5  Knee extension 5/5 5/5  Ankle dorsiflexion 5/5 24, 23 lbs 25.9, 25.2 lbs  Ankle plantarflexion   3-/5 3-/5   Ankle inversion 5/5 12.3, 12.4 lbs 13.7, 15.2 lbs  Ankle eversion 5/5 27.4, 25.2 lbs  19.2, 21 lbs   (Blank rows = not tested)     LOWER EXTREMITY SPECIAL TESTS:  04/04/2022: none performed today   FUNCTIONAL TESTS:  04/04/2022: Lt SLS:  < 3 seconds, Rt SLS < 3 seconds   GAIT: 04/04/2022:  Distance walked: independent ambulation c mild limitation in DF toe off progression.      TODAY'S TREATMENT: 04/10/2022: Therex:  Recumbent bike lvl 2 10 mins  Lateral step down control 6 inch x 15 bilateral  Standing PF c eccentric lowering on Lt focus (majority of weight) 2 x 15 , bilateral   Neuro Re-ed  Feet together [employer] pew anterior/posterior weight shift 2 mins eyes closed  SLS c vector reach fwd/lateral/side c contralateral leg light touch x 8 bilateral  Fitter board rocker fwd/back light touch to ground control focus x30 occassional hand assist, SBA  Tandem stance on foam 1 min x 2 bilateral   Tandem ambulation 6 ft foam beam in // bars 6 x fwd/back  04/08/2022: Therex:  Recumbent bike lvl 2 6 mins  6 inch step on over and down x 15, performed bilaterally  Standing PF c eccentric lowering on Lt focus (majority of weight) 2 x 10 , bilateral   Neuro Re-ed  Feet together on foam eyes closed 60 seconds, eyes open c head turn Lt then Rt focus 30 sec each  Feet together [employer]w anterior/posterior weight shift 2 mins  SLS on foam c contralateral cone touch (anterior, anterior/lateral, anterior/medial) x 6 bilateral c close SBA/CGA  SLS c vector reach fwd/lateral/side c contralateral leg light touch x 8 bilateral  Fitter board rocker fwd/back light touch to ground control focus x30 occassional hand assist, SBA  04/04/2022: Therex:  HEP instruction/performance c cues for techniques, handout provided.  Trial set performed of each for comprehension and symptom assessment.  See below for exercise list.     PATIENT EDUCATION:  04/04/2022:  Education details: HEP,  POC Person educated: Patient Education method: Consulting civil engineer, Demonstration, Verbal cues, and Handouts Education comprehension: verbalized understanding and returned demonstration     HOME EXERCISE PROGRAM: 04/04/2022:  Access Code: [email or web address] URL: [email or web address] Date: 04/04/2022 Prepared by: Scot Jun   Exercises - Long Sitting Ankle Eversion with Resistance  - 2 x daily - 7 x weekly - 3 sets - 10 reps - Long Sitting Ankle Inversion with Resistance  - 2 x  daily - 7 x weekly - 3 sets - 10 reps - Single Leg Stance  - 1 x daily - 7 x weekly - 1 sets - 5 reps - 20-30 hold - Tandem Stance  - 1 x daily - 7 x weekly - 1 sets - 5 reps - 20-30 hold - Standing Heel Raise  - 1-2 x daily - 7 x weekly - 2-3 sets - 10 reps - Gastroc Stretch on Wall  - 1-2 x daily - 7 x weekly - 1 sets - 5 reps - 30 hold - Standing Dorsiflexion Self-Mobilization on Step  - 2 x daily - 7 x weekly - 3 sets - 10-15 reps - 1-2 hold       ASSESSMENT:   CLINICAL IMPRESSION:  Fair control on compliant surface balance interventions today.  Improvement in these tasks will aid and improve functional movement control in community environment on even and uneven surfaces.  Progressive strengthening also to benefit Pt as well.      Objective impairments include Abnormal gait, decreased activity tolerance, decreased balance, decreased coordination, decreased endurance, decreased mobility, difficulty walking, decreased ROM, decreased strength, hypomobility, increased edema, impaired flexibility, improper body mechanics, postural dysfunction, and pain. These impairments are limiting patient from cleaning, community activity, driving, meal prep, occupation, laundry, and shopping.  Patient will benefit from skilled PT to address above impairments and improve overall function.   REHAB POTENTIAL: Good   CLINICAL DECISION MAKING: Stable/uncomplicated   EVALUATION COMPLEXITY: Low     GOALS: Goals reviewed  with patient? Yes   SHORT TERM GOALS:   STG Name Target Date Goal status  1 Patient will demonstrate independent use of home exercise program to maintain progress from in clinic treatments.  04/18/2022 On going 04/08/2022                                                          LONG TERM GOALS:    LTG Name Target Date Goal status  1 Patient will demonstrate/report pain at worst less than or equal to 2/10 to facilitate minimal limitation in daily activity secondary to pain symptoms.  05/16/2022 INITIAL  2 Patient will demonstrate independent use of home exercise program to facilitate ability to maintain/progress functional gains from skilled physical therapy services.  05/16/2022 INITIAL           3 Patient will demonstrate dynamometry within 10 %, ankle 5/5 throughout to facilitate ability to perform usual standing, walking, stairs at PLOF s limitation due to symptoms. 05/16/2022 INITIAL  4 Patient will demonstrate independent ambulation community distances > 300 ft without deviation.  05/16/2022 INITIAL  5 Patient will demonstrate bilateral SLS > 15 seconds to facilitate stability in ambulation.  05/16/2022 INITIAL             PLAN: PT FREQUENCY: 1-2x/week   PT DURATION: 6 weeks   (WORKERS COMP approved 6 visits total)   PLANNED INTERVENTIONS: Therapeutic exercises, Therapeutic activity, Neuro Muscular re-education, Balance training, Gait training, Patient/Family education, Joint mobilization, Stair training, DME instructions, Dry Needling, Electrical stimulation, Cryotherapy, Moist heat, Taping, Ultrasound, Ionotophoresis '4mg'$ /ml Dexamethasone, and Manual therapy.  All included unless contraindicated   PLAN FOR NEXT SESSION: compliant surface balance improvements, progressive strengthening.  Scot Jun, PT, DPT, OCS, ATC 04/10/22  10:56 AM

## 2022-04-21 ENCOUNTER — Encounter: Payer: Self-pay | Admitting: Rehabilitative and Restorative Service Providers"

## 2022-04-21 ENCOUNTER — Ambulatory Visit (INDEPENDENT_AMBULATORY_CARE_PROVIDER_SITE_OTHER): Payer: No Typology Code available for payment source | Admitting: Rehabilitative and Restorative Service Providers"

## 2022-04-21 DIAGNOSIS — M25572 Pain in left ankle and joints of left foot: Secondary | ICD-10-CM | POA: Diagnosis not present

## 2022-04-21 DIAGNOSIS — R262 Difficulty in walking, not elsewhere classified: Secondary | ICD-10-CM

## 2022-04-21 DIAGNOSIS — M6281 Muscle weakness (generalized): Secondary | ICD-10-CM

## 2022-04-21 NOTE — Therapy (Signed)
OUTPATIENT PHYSICAL THERAPY TREATMENT NOTE   Patient Name: Christie Davis MRN: [id number] DOB:09-30-1945, 76 y.o., female Today's Date: 04/21/2022  PCP: Harlan Stains, MD   REFERRING PROVIDER: Donella Stade, PA-C  END OF SESSION:   PT End of Session - 04/21/22 1013     Visit Number 4    Number of Visits 6    Date for PT Re-Evaluation 05/16/22    Authorization Type WC 6 visits    Authorization - Number of Visits 4    Progress Note Due on Visit 6    PT Start Time 1014    PT Stop Time 1054    PT Time Calculation (min) 40 min    Activity Tolerance Patient tolerated treatment well    Behavior During Therapy WFL for tasks assessed/performed               Past Medical History:  Diagnosis Date   Chronic cough    off and on-   Hypertension    Medical history non-contributory    Wears glasses    Past Surgical History:  Procedure Laterality Date   COLONOSCOPY     ORIF HUMERUS FRACTURE Right 12/06/2012   Procedure: OPEN REDUCTION INTERNAL FIXATION (ORIF) PROXIMAL HUMERUS FRACTURE;  Surgeon: Nita Sells, MD;  Location: [facility];  Service: Orthopedics;  Laterality: Right;   RECTAL TUMOR BY PROCTOTOMY EXCISION  2007   TUBAL LIGATION     There are no problems to display for this patient.   REFERRING DIAG: B76.283T (ICD-10-CM) - Closed fracture of left ankle, initial encounter  ONSET DATE: 10/22/2021  THERAPY DIAG:  Pain in left ankle and joints of left foot  Muscle weakness (generalized)  Difficulty in walking, not elsewhere classified  Rationale for Evaluation and Treatment Rehabilitation  SUBJECTIVE:    SUBJECTIVE STATEMENT: Pt indicated having increased activity with up and down steps and walking.  Took Tylenol 2x.     PERTINENT HISTORY: HTN.  Injury 10/22/2021 with ED visit on 10/23/2021.  Treatment cycle for condition at clinic previously.    PAIN:  NPRS scale: pain at worst up to 6/10 Pain location: ankle/foot Pain  orientation: Left  Pain description: aching, burning Aggravating factors: evening Relieving factors: tylenol   PRECAUTIONS: none   WEIGHT BEARING RESTRICTIONS None   FALLS:  Has patient fallen in last 6 months? Yes, Number of falls: 1     LIVING ENVIRONMENT: Lives with: lives with their family Lives in: House/apartment Stairs: 2 story house (bedroom on 2nd floor) with rails on both sides before landing then 1 rail for second part.  4 stairs to enter with bilateral hand rails reported (maybe able to touch).  Scooting on bottom.  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   PLOF: Independent, walking in neighborhood, housework    PATIENT GOALS Reduce pain, get back to normal routine     OBJECTIVE:      PATIENT SURVEYS:  04/04/2022: FOTO Ankle intake not gathered by front desk on evaluation.    COGNITION: 04/04/2022: Overall cognitive status: Within functional limits for tasks assessed                    PALPATION: 04/04/2022: tenderness at posterior to lateral malleolus and fibularis brevis tendon attachment   LE AROM/PROM:   AROM  Seated with 90* knee flexion Right 04/04/2022 Left 04/04/2022  Hip flexion      Hip extension      Hip abduction      Hip adduction      Hip internal rotation      Hip external rotation      Knee flexion      Knee extension      Ankle dorsiflexion 14* 10*  Ankle plantarflexion      Ankle inversion  35* 30*  Ankle eversion  15* 20*   (Blank rows = not tested)   LE MMT:   MMT Right 04/04/2022 Left 04/04/2022 Right 04/21/2022 Left 04/21/2022  Hip flexion 5/5 4/5    Hip extension        Hip abduction        Hip adduction        Hip internal rotation        Hip external rotation        Knee flexion 5/5 5/5    Knee extension 5/5 5/5    Ankle dorsiflexion 5/5 24, 23 lbs 25.9, 25.2 lbs    Ankle plantarflexion  3-/5  3-/5  3/5 3/5  Ankle inversion 5/5 12.3, 12.4 lbs 13.7, 15.2 lbs    Ankle eversion 5/5 27.4, 25.2 lbs  19.2, 21 lbs     (Blank rows = not tested)     LOWER EXTREMITY SPECIAL TESTS:  04/04/2022: none performed today   FUNCTIONAL TESTS:   04/21/2022:  Lt SLS:  3 seconds    Rt SLS:    4 seconds   04/04/2022: Lt SLS:  < 3 seconds, Rt SLS < 3 seconds   GAIT: 04/04/2022:  Distance walked: independent ambulation c mild limitation in DF toe off progression.      TODAY'S TREATMENT: 04/21/2022: Therex:  Recumbent bike lvl 2 10 mins  Step on over and down 6 inch x 15 bilateral   Standing PF c eccentric lowering on Lt focus (majority of weight) x 20, bilateral  Neuro Re-ed  SLS c occasional to moderate hand assist on bar 20 sec x 5 bilateral  SLS on foam c corner touching and return x 6 bilateral  SLS c vector reach fwd/lateral/side c contralateral leg light touch x 8 bilateral  Feet together on foam heel/toe raises alternating x 20 each     04/10/2022: Therex:  Recumbent bike lvl 2 10 mins  Lateral step down control 6 inch x 15 bilateral  Standing PF c eccentric lowering on Lt focus (majority of weight) 2 x 15 , bilateral   Neuro Re-ed  Feet together [employer] pew anterior/posterior weight shift 2 mins eyes closed  SLS c vector reach fwd/lateral/side c contralateral leg light touch x 8 bilateral  Fitter board rocker fwd/back light touch to ground control focus x30 occassional hand assist, SBA  Tandem stance on foam 1 min x 2 bilateral   Tandem ambulation 6 ft foam beam in // bars 6 x fwd/back  04/08/2022: Therex:  Recumbent bike lvl 2 6 mins  6 inch step on over and down x 15, performed bilaterally  Standing PF c eccentric lowering on Lt focus (majority of weight) 2 x 10 , bilateral   Neuro Re-ed  Feet together on foam eyes closed 60 seconds, eyes open c head turn Lt then Rt focus 30 sec each  Feet together [employer]w anterior/posterior weight shift 2 mins  SLS on foam c  contralateral cone touch (anterior, anterior/lateral, anterior/medial) x 6 bilateral c close SBA/CGA  SLS c vector reach fwd/lateral/side c contralateral  leg light touch x 8 bilateral  Fitter board rocker fwd/back light touch to ground control focus x30 occassional hand assist, SBA     PATIENT EDUCATION:  04/04/2022:  Education details: HEP, POC Person educated: Patient Education method: Consulting civil engineer, Demonstration, Verbal cues, and Handouts Education comprehension: verbalized understanding and returned demonstration     HOME EXERCISE PROGRAM: 04/04/2022:  Access Code: [email or web address] URL: [email or web address] Date: 04/04/2022 Prepared by: Scot Jun   Exercises - Long Sitting Ankle Eversion with Resistance  - 2 x daily - 7 x weekly - 3 sets - 10 reps - Long Sitting Ankle Inversion with Resistance  - 2 x daily - 7 x weekly - 3 sets - 10 reps - Single Leg Stance  - 1 x daily - 7 x weekly - 1 sets - 5 reps - 20-30 hold - Tandem Stance  - 1 x daily - 7 x weekly - 1 sets - 5 reps - 20-30 hold - Standing Heel Raise  - 1-2 x daily - 7 x weekly - 2-3 sets - 10 reps - Gastroc Stretch on Wall  - 1-2 x daily - 7 x weekly - 1 sets - 5 reps - 30 hold - Standing Dorsiflexion Self-Mobilization on Step  - 2 x daily - 7 x weekly - 3 sets - 10-15 reps - 1-2 hold       ASSESSMENT:   CLINICAL IMPRESSION:  Recheck today showed continued PF strength deficits (noted bilateral) but c mild improvement.  Single leg stance control and movement coordination in general continued to show deficits vs. Goals.  Continued emphasis in HEP and in clinic to address strength and movement coordination indicated at this time.      Objective impairments include Abnormal gait, decreased activity tolerance, decreased balance, decreased coordination, decreased endurance, decreased mobility, difficulty walking, decreased ROM, decreased strength, hypomobility, increased edema, impaired flexibility, improper body  mechanics, postural dysfunction, and pain. These impairments are limiting patient from cleaning, community activity, driving, meal prep, occupation, laundry, and shopping.  Patient will benefit from skilled PT to address above impairments and improve overall function.   REHAB POTENTIAL: Good   CLINICAL DECISION MAKING: Stable/uncomplicated   EVALUATION COMPLEXITY: Low     GOALS: Goals reviewed with patient? Yes   SHORT TERM GOALS:   STG Name Target Date Goal status  1 Patient will demonstrate independent use of home exercise program to maintain progress from in clinic treatments.  04/18/2022 MET                                                          LONG TERM GOALS:    LTG Name Target Date Goal status  1 Patient will demonstrate/report pain at worst less than or equal to 2/10 to facilitate minimal limitation in daily activity secondary to pain symptoms.  05/16/2022 On going assessed 04/21/2022  2 Patient will demonstrate independent use of home exercise program to facilitate ability to maintain/progress functional gains from skilled physical therapy services.  05/16/2022 On going assessed 04/21/2022           3 Patient will demonstrate dynamometry within 10 %, ankle 5/5 throughout to facilitate ability to perform usual standing, walking, stairs at PLOF s limitation due to symptoms. 05/16/2022 On going assessed 04/21/2022  4 Patient will demonstrate independent ambulation community distances >  300 ft without deviation.  05/16/2022 On going assessed 04/21/2022  5 Patient will demonstrate bilateral SLS > 15 seconds to facilitate stability in ambulation.  05/16/2022 On going assessed 04/21/2022             PLAN: PT FREQUENCY: 1-2x/week   PT DURATION: 6 weeks   (WORKERS COMP approved 6 visits total)   PLANNED INTERVENTIONS: Therapeutic exercises, Therapeutic activity, Neuro Muscular re-education, Balance training, Gait training, Patient/Family education, Joint mobilization, Stair training,  DME instructions, Dry Needling, Electrical stimulation, Cryotherapy, Moist heat, Taping, Ultrasound, Ionotophoresis 76m/ml Dexamethasone, and Manual therapy.  All included unless contraindicated   PLAN FOR NEXT SESSION: compliant surface balance improvements, progressive strengthening continued.    MScot Jun PT, DPT, OCS, ATC 04/21/22  10:55 AM

## 2022-04-28 ENCOUNTER — Encounter: Payer: Self-pay | Admitting: Rehabilitative and Restorative Service Providers"

## 2022-04-28 ENCOUNTER — Ambulatory Visit (INDEPENDENT_AMBULATORY_CARE_PROVIDER_SITE_OTHER): Payer: No Typology Code available for payment source | Admitting: Rehabilitative and Restorative Service Providers"

## 2022-04-28 DIAGNOSIS — M6281 Muscle weakness (generalized): Secondary | ICD-10-CM | POA: Diagnosis not present

## 2022-04-28 DIAGNOSIS — M25572 Pain in left ankle and joints of left foot: Secondary | ICD-10-CM

## 2022-04-28 DIAGNOSIS — R262 Difficulty in walking, not elsewhere classified: Secondary | ICD-10-CM

## 2022-04-28 NOTE — Therapy (Signed)
OUTPATIENT PHYSICAL THERAPY TREATMENT NOTE   Patient Name: Christie Davis MRN: [id number] DOB:02-29-1946, 76 y.o., female Today's Date: 04/28/2022  PCP: Harlan Stains, MD   REFERRING PROVIDER: Donella Stade, PA-C  END OF SESSION:   PT End of Session - 04/28/22 1018     Visit Number 5    Number of Visits 6    Date for PT Re-Evaluation 05/16/22    Authorization Type WC 6 visits    Authorization - Number of Visits 5    Progress Note Due on Visit 6    PT Start Time 1013    PT Stop Time 1053    PT Time Calculation (min) 40 min    Activity Tolerance Patient tolerated treatment well    Behavior During Therapy WFL for tasks assessed/performed                Past Medical History:  Diagnosis Date   Chronic cough    off and on-   Hypertension    Medical history non-contributory    Wears glasses    Past Surgical History:  Procedure Laterality Date   COLONOSCOPY     ORIF HUMERUS FRACTURE Right 12/06/2012   Procedure: OPEN REDUCTION INTERNAL FIXATION (ORIF) PROXIMAL HUMERUS FRACTURE;  Surgeon: Nita Sells, MD;  Location: [facility];  Service: Orthopedics;  Laterality: Right;   RECTAL TUMOR BY PROCTOTOMY EXCISION  2007   TUBAL LIGATION     There are no problems to display for this patient.   REFERRING DIAG: O87.867E (ICD-10-CM) - Closed fracture of left ankle, initial encounter  ONSET DATE: 10/22/2021  THERAPY DIAG:  Pain in left ankle and joints of left foot  Muscle weakness (generalized)  Difficulty in walking, not elsewhere classified  Rationale for Evaluation and Treatment Rehabilitation  SUBJECTIVE:    SUBJECTIVE STATEMENT: Pt indicated up to 6/10 at times still.  Noted in sitting (both after activity and in morning).  Pt indicated she has continued to work on balance intervention and still feels like she has trouble with it.  Improving some she noted.    PERTINENT HISTORY: HTN.  Injury 10/22/2021 with ED visit on  10/23/2021.  Treatment cycle for condition at clinic previously.    PAIN:  NPRS scale: pain at worst up to 6/10 Pain location: ankle/foot Pain orientation: Left  Pain description: aching, burning Aggravating factors: evening Relieving factors: tylenol   PRECAUTIONS: none   WEIGHT BEARING RESTRICTIONS None   FALLS:  Has patient fallen in last 6 months? Yes, Number of falls: 1     LIVING ENVIRONMENT: Lives with: lives with their family Lives in: House/apartment Stairs: 2 story house (bedroom on 2nd floor) with rails on both sides before landing then 1 rail for second part.  4 stairs to enter with bilateral hand rails reported (maybe able to touch).  Scooting on bottom.  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   PLOF: Independent, walking in neighborhood, housework    PATIENT GOALS Reduce pain, get back to normal routine     OBJECTIVE:      PATIENT SURVEYS:  04/04/2022: FOTO Ankle intake not gathered by front desk on evaluation.    COGNITION: 04/04/2022: Overall cognitive status: Within functional limits for tasks assessed                    PALPATION: 04/04/2022: tenderness at  posterior to lateral malleolus and fibularis brevis tendon attachment   LE AROM/PROM:   AROM  Seated with 90* knee flexion Right 04/04/2022 Left 04/04/2022 Left 04/28/2022  Hip flexion       Hip extension       Hip abduction       Hip adduction       Hip internal rotation       Hip external rotation       Knee flexion       Knee extension       Ankle dorsiflexion 14* 10* 15  Ankle plantarflexion       Ankle inversion  35* 30* 30  Ankle eversion  15* 20* 30   (Blank rows = not tested)   LE MMT:   MMT Right 04/04/2022 Left 04/04/2022 Right 04/21/2022 Left 04/21/2022  Hip flexion 5/5 4/5    Hip extension        Hip abduction        Hip adduction        Hip internal  rotation        Hip external rotation        Knee flexion 5/5 5/5    Knee extension 5/5 5/5    Ankle dorsiflexion 5/5 24, 23 lbs 25.9, 25.2 lbs    Ankle plantarflexion  3-/5 3-/5  3/5 3/5  Ankle inversion 5/5 12.3, 12.4 lbs 13.7, 15.2 lbs    Ankle eversion 5/5 27.4, 25.2 lbs  19.2, 21 lbs     (Blank rows = not tested)     LOWER EXTREMITY SPECIAL TESTS:  04/04/2022: none performed today   FUNCTIONAL TESTS:   04/21/2022:  Lt SLS:  3 seconds    Rt SLS:    4 seconds   04/04/2022: Lt SLS:  < 3 seconds, Rt SLS < 3 seconds   GAIT: 04/04/2022:  Distance walked: independent ambulation c mild limitation in DF toe off progression.      TODAY'S TREATMENT: 04/28/2022: Therex:  Recumbent bike lvl 3 10 mins  Incline board runner stretch 30 sec x 3 Lt leg posterior  Blue band eversion Lt leg 2 x 15  Incline board double leg PF 2 x 10 , slow lowering  Neuro Re-ed  Tandem ambulation fwd/back 10 ft x 5 each way   SLS on foam c corner touching and return x 8 bilateral  SLS c vector reach fwd/lateral/side c contralateral leg light touch x 8 bilateral    04/21/2022: Therex:  Recumbent bike lvl 2 10 mins  Step on over and down 6 inch x 15 bilateral   Standing PF c eccentric lowering on Lt focus (majority of weight) x 20, bilateral  Neuro Re-ed  SLS c occasional to moderate hand assist on bar 20 sec x 5 bilateral  SLS on foam c corner touching and return x 6 bilateral  SLS c vector reach fwd/lateral/side c contralateral leg light touch x 8 bilateral  Feet together on foam heel/toe raises alternating x 20 each     04/10/2022: Therex:  Recumbent bike lvl 2 10 mins  Lateral step down control 6 inch x 15 bilateral  Standing PF c eccentric lowering on Lt focus (majority of weight) 2 x 15 , bilateral   Neuro Re-ed  Feet together [employer] pew anterior/posterior weight shift 2 mins eyes closed  SLS c vector reach fwd/lateral/side c contralateral leg light touch x 8 bilateral  Fitter board rocker  fwd/back light touch to ground control focus x30 occassional  hand assist, SBA  Tandem stance on foam 1 min x 2 bilateral   Tandem ambulation 6 ft foam beam in // bars 6 x fwd/back   PATIENT EDUCATION:  04/04/2022:  Education details: HEP, POC Person educated: Patient Education method: Consulting civil engineer, Media planner, Verbal cues, and Handouts Education comprehension: verbalized understanding and returned demonstration     HOME EXERCISE PROGRAM: 04/04/2022:  Access Code: [email or web address] URL: [email or web address] Date: 04/04/2022 Prepared by: Scot Jun   Exercises - Long Sitting Ankle Eversion with Resistance  - 2 x daily - 7 x weekly - 3 sets - 10 reps - Long Sitting Ankle Inversion with Resistance  - 2 x daily - 7 x weekly - 3 sets - 10 reps - Single Leg Stance  - 1 x daily - 7 x weekly - 1 sets - 5 reps - 20-30 hold - Tandem Stance  - 1 x daily - 7 x weekly - 1 sets - 5 reps - 20-30 hold - Standing Heel Raise  - 1-2 x daily - 7 x weekly - 2-3 sets - 10 reps - Gastroc Stretch on Wall  - 1-2 x daily - 7 x weekly - 1 sets - 5 reps - 30 hold - Standing Dorsiflexion Self-Mobilization on Step  - 2 x daily - 7 x weekly - 3 sets - 10-15 reps - 1-2 hold       ASSESSMENT:   CLINICAL IMPRESSION:  Active range improved compared to evaluation presentation, mostly importantly in DF mobility for ambulation mobility.  Improvements to strength and control for medial/lateral instability to help improve functional mobility control and daily activity tolerance/stability.      Objective impairments include Abnormal gait, decreased activity tolerance, decreased balance, decreased coordination, decreased endurance, decreased mobility, difficulty walking, decreased ROM, decreased strength, hypomobility, increased edema, impaired flexibility, improper body mechanics, postural dysfunction, and pain. These impairments are limiting patient from cleaning, community activity, driving, meal prep,  occupation, laundry, and shopping.  Patient will benefit from skilled PT to address above impairments and improve overall function.   REHAB POTENTIAL: Good   CLINICAL DECISION MAKING: Stable/uncomplicated   EVALUATION COMPLEXITY: Low     GOALS: Goals reviewed with patient? Yes   SHORT TERM GOALS:   STG Name Target Date Goal status  1 Patient will demonstrate independent use of home exercise program to maintain progress from in clinic treatments.  04/18/2022 MET                                                          LONG TERM GOALS:    LTG Name Target Date Goal status  1 Patient will demonstrate/report pain at worst less than or equal to 2/10 to facilitate minimal limitation in daily activity secondary to pain symptoms.  05/16/2022 On going assessed 04/21/2022  2 Patient will demonstrate independent use of home exercise program to facilitate ability to maintain/progress functional gains from skilled physical therapy services.  05/16/2022 On going assessed 04/21/2022           3 Patient will demonstrate dynamometry within 10 %, ankle 5/5 throughout to facilitate ability to perform usual standing, walking, stairs at PLOF s limitation due to symptoms. 05/16/2022 On going assessed 04/21/2022  4 Patient will demonstrate independent ambulation community distances > 300 ft without deviation.  05/16/2022 On going assessed 04/21/2022  5 Patient will demonstrate bilateral SLS > 15 seconds to facilitate stability in ambulation.  05/16/2022 On going assessed 04/21/2022             PLAN: PT FREQUENCY: 1-2x/week   PT DURATION: 6 weeks   (WORKERS COMP approved 6 visits total)   PLANNED INTERVENTIONS: Therapeutic exercises, Therapeutic activity, Neuro Muscular re-education, Balance training, Gait training, Patient/Family education, Joint mobilization, Stair training, DME instructions, Dry Needling, Electrical stimulation, Cryotherapy, Moist heat, Taping, Ultrasound, Ionotophoresis 31m/ml Dexamethasone,  and Manual therapy.  All included unless contraindicated   PLAN FOR NEXT SESSION: Last approved visit next time- Progress/MD note needed  MScot Jun PT, DPT, OCS, ATC 04/28/22  10:53 AM

## 2022-05-05 ENCOUNTER — Encounter: Payer: Self-pay | Admitting: Rehabilitative and Restorative Service Providers"

## 2022-05-05 ENCOUNTER — Ambulatory Visit (INDEPENDENT_AMBULATORY_CARE_PROVIDER_SITE_OTHER): Payer: No Typology Code available for payment source | Admitting: Rehabilitative and Restorative Service Providers"

## 2022-05-05 DIAGNOSIS — M6281 Muscle weakness (generalized): Secondary | ICD-10-CM | POA: Diagnosis not present

## 2022-05-05 DIAGNOSIS — M25572 Pain in left ankle and joints of left foot: Secondary | ICD-10-CM

## 2022-05-05 DIAGNOSIS — R262 Difficulty in walking, not elsewhere classified: Secondary | ICD-10-CM | POA: Diagnosis not present

## 2022-05-05 NOTE — Therapy (Addendum)
OUTPATIENT PHYSICAL THERAPY TREATMENT NOTE /PROGRESS NOTE  /DISCHARGE   Patient Name: Christie Davis MRN: [id number] DOB:11-11-1945, 76 y.o., female Today's Date: 05/05/2022  PCP: Harlan Stains, MD   REFERRING PROVIDER: Donella Stade, PA-C  Progress Note Reporting Period 04/04/2022 to 05/05/2022  See note below for Objective Data and Assessment of Progress/Goals.   END OF SESSION:   PT End of Session - 05/05/22 1021     Visit Number 6    Number of Visits 6    Date for PT Re-Evaluation 05/16/22    Authorization Type WC 6 visits    Authorization - Number of Visits 6    Progress Note Due on Visit 6    PT Start Time 1013    PT Stop Time 1055    PT Time Calculation (min) 42 min    Activity Tolerance Patient tolerated treatment well    Behavior During Therapy WFL for tasks assessed/performed                 Past Medical History:  Diagnosis Date   Chronic cough    off and on-   Hypertension    Medical history non-contributory    Wears glasses    Past Surgical History:  Procedure Laterality Date   COLONOSCOPY     ORIF HUMERUS FRACTURE Right 12/06/2012   Procedure: OPEN REDUCTION INTERNAL FIXATION (ORIF) PROXIMAL HUMERUS FRACTURE;  Surgeon: Nita Sells, MD;  Location: [facility];  Service: Orthopedics;  Laterality: Right;   RECTAL TUMOR BY PROCTOTOMY EXCISION  2007   TUBAL LIGATION     There are no problems to display for this patient.   REFERRING DIAG: N82.956O (ICD-10-CM) - Closed fracture of left ankle, initial encounter  ONSET DATE: 10/22/2021  THERAPY DIAG:  Pain in left ankle and joints of left foot  Muscle weakness (generalized)  Difficulty in walking, not elsewhere classified  Rationale for Evaluation and Treatment Rehabilitation  SUBJECTIVE:    SUBJECTIVE STATEMENT: Pt indicated pain at worst to 6/10 on lateral ankle.   Pt indicated overall improvement to normal around 60-70% at this time.  Pt indicated  pain, fatigue and occasional swelling can impact overall improvement reporting.  Pt indicated doing most of what she normally does with just some fatigue/pain with it at times.  Pt indicated she hasn't returned to work (planning for Sept 5th).    PERTINENT HISTORY: HTN.  Injury 10/22/2021 with ED visit on 10/23/2021.  Treatment cycle for condition at clinic previously.    PAIN:  NPRS scale: pain at worst up to 6/10 Pain location: ankle/foot Pain orientation: Left  Pain description: aching, burning Aggravating factors: evening Relieving factors: tylenol   PRECAUTIONS: none   WEIGHT BEARING RESTRICTIONS None   FALLS:  Has patient fallen in last 6 months? Yes, Number of falls: 1     LIVING ENVIRONMENT: Lives with: lives with their family Lives in: House/apartment Stairs: 2 story house (bedroom on 2nd floor) with rails on both sides before landing then 1 rail for second part.  4 stairs to enter with bilateral hand rails reported (maybe able to touch).  Scooting on bottom.  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   PLOF: Independent, walking in neighborhood, housework    PATIENT GOALS Reduce pain, get back to normal routine     OBJECTIVE:      PATIENT SURVEYS:  04/04/2022: FOTO Ankle intake not gathered by front desk on evaluation.    COGNITION: 04/04/2022: Overall cognitive status: Within functional limits for tasks assessed                    PALPATION: 04/04/2022: tenderness at posterior to lateral malleolus and fibularis brevis tendon attachment   LE AROM/PROM:   AROM  Seated with 90* knee flexion Right 04/04/2022 Left 04/04/2022 Left 04/28/2022  Hip flexion       Hip extension       Hip abduction       Hip adduction       Hip internal rotation       Hip external rotation       Knee flexion       Knee extension       Ankle dorsiflexion 14* 10* 15   Ankle plantarflexion       Ankle inversion  35* 30* 30  Ankle eversion  15* 20* 30   (Blank rows = not tested)   LE MMT:   MMT Right 04/04/2022 Left 04/04/2022 Right 04/21/2022 Left 04/21/2022 Left 05/05/2022  Hip flexion 5/5 4/5     Hip extension         Hip abduction         Hip adduction         Hip internal rotation         Hip external rotation         Knee flexion 5/5 5/5     Knee extension 5/5 5/5     Ankle dorsiflexion 5/5 24, 23 lbs 25.9, 25.2 lbs   5/5 28.2, 27.3 lbs  Ankle plantarflexion  3-/5 3-/5  3/5 3/5 3/5  Ankle inversion 5/5 12.3, 12.4 lbs 13.7, 15.2 lbs   5/5 18.9, 16.6  Ankle eversion 5/5 27.4, 25.2 lbs  19.2, 21 lbs   5/5 24.7 24.8  lbs   (Blank rows = not tested)     LOWER EXTREMITY SPECIAL TESTS:  04/04/2022: none performed today   FUNCTIONAL TESTS:   05/05/2022:  Lt SLS:  6 seconds    Rt SLS:   6 seconds    04/21/2022:  Lt SLS:  3 seconds    Rt SLS:    4 seconds   04/04/2022: Lt SLS:  < 3 seconds, Rt SLS < 3 seconds   GAIT: 04/04/2022:  Distance walked: independent ambulation c mild limitation in DF toe off progression.      TODAY'S TREATMENT: 05/05/2022: Therex:  Recumbent bike lvl 3 10 mins  Incline board runner stretch 30 sec x 3 Lt leg posterior  Incline board double leg PF 2 x 10 , slow lowering on Lt majority Step on over and down WB on Lt leg x 15 c single handrail assist.   Neuro Re-ed  Tandem stance on foam 90 seconds bilateral  SLS on foam c corner touching and return x 8 bilateral with moderate instances of hand touch to bar    04/28/2022: Therex:  Recumbent bike lvl 3 10 mins  Incline board runner stretch 30 sec x 3 Lt leg posterior  Blue band eversion Lt leg 2 x 15  Incline board double leg PF 2 x 10 , slow lowering    Neuro Re-ed  Tandem ambulation fwd/back 10 ft x 5 each way   SLS on foam c corner touching and return x 8 bilateral  SLS c vector reach fwd/lateral/side c contralateral leg light touch x 8  bilateral    04/21/2022: Therex:  Recumbent bike lvl 2 10 mins  Step on over and down 6 inch x 15 bilateral   Standing PF c eccentric lowering on Lt focus (majority of weight) x 20, bilateral  Neuro Re-ed  SLS c occasional to moderate hand assist on bar 20 sec x 5 bilateral  SLS on foam c corner touching and return x 6 bilateral  SLS c vector reach fwd/lateral/side c contralateral leg light touch x 8 bilateral  Feet together on foam heel/toe raises alternating x 20 each     PATIENT EDUCATION:  04/04/2022:  Education details: HEP, POC Person educated: Patient Education method: Consulting civil engineer, Media planner, Verbal cues, and Handouts Education comprehension: verbalized understanding and returned demonstration     HOME EXERCISE PROGRAM: 04/04/2022:  Access Code: [email or web address] URL: [email or web address] Date: 04/04/2022 Prepared by: Scot Jun   Exercises - Long Sitting Ankle Eversion with Resistance  - 2 x daily - 7 x weekly - 3 sets - 10 reps - Long Sitting Ankle Inversion with Resistance  - 2 x daily - 7 x weekly - 3 sets - 10 reps - Single Leg Stance  - 1 x daily - 7 x weekly - 1 sets - 5 reps - 20-30 hold - Tandem Stance  - 1 x daily - 7 x weekly - 1 sets - 5 reps - 20-30 hold - Standing Heel Raise  - 1-2 x daily - 7 x weekly - 2-3 sets - 10 reps - Gastroc Stretch on Wall  - 1-2 x daily - 7 x weekly - 1 sets - 5 reps - 30 hold - Standing Dorsiflexion Self-Mobilization on Step  - 2 x daily - 7 x weekly - 3 sets - 10-15 reps - 1-2 hold       ASSESSMENT:   CLINICAL IMPRESSION:  Pt has attended all approved 6 visits for current treatment cycle from worker's compensation.  Pt reported moderate symptoms can still occur but less frequency than previous.  See objective data for updated information.  Improvements noted in most areas measured with most obvious impairments remaining being PF strength and overall movement coordination stability.  Pt has reported progression  towards 100 % PLOF.  End of approved visits at this time.  Pt to benefit from continued interventions with HEP to address strength and balance.  Await additional information regarding plan for any continued skilled PT services.      Objective impairments include Abnormal gait, decreased activity tolerance, decreased balance, decreased coordination, decreased endurance, decreased mobility, difficulty walking, decreased ROM, decreased strength, hypomobility, increased edema, impaired flexibility, improper body mechanics, postural dysfunction, and pain. These impairments are limiting patient from cleaning, community activity, driving, meal prep, occupation, laundry, and shopping.  Patient will benefit from skilled PT to address above impairments and improve overall function.   REHAB POTENTIAL: Good   CLINICAL DECISION MAKING: Stable/uncomplicated   EVALUATION COMPLEXITY: Low     GOALS: Goals reviewed with patient? Yes   SHORT TERM GOALS:   STG Name Target Date Goal status  1 Patient will demonstrate independent use of home exercise program to maintain progress from in clinic treatments.  04/18/2022 MET                                                          LONG  TERM GOALS:    LTG Name Target Date Goal status  1 Patient will demonstrate/report pain at worst less than or equal to 2/10 to facilitate minimal limitation in daily activity secondary to pain symptoms.  05/16/2022 Partially met 05/05/2022  2 Patient will demonstrate independent use of home exercise program to facilitate ability to maintain/progress functional gains from skilled physical therapy services.  05/16/2022 Met  05/05/2022           3 Patient will demonstrate dynamometry within 10 %, ankle 5/5 throughout to facilitate ability to perform usual standing, walking, stairs at PLOF s limitation due to symptoms. 05/16/2022 Partially Met 05/05/2022  4 Patient will demonstrate independent ambulation community distances > 300 ft  without deviation.  05/16/2022 Met 05/05/2022  5 Patient will demonstrate bilateral SLS > 15 seconds to facilitate stability in ambulation.  05/16/2022 Partially met 05/05/2022             PLAN: PT FREQUENCY: 1-2x/week   PT DURATION: 6 weeks   (WORKERS COMP approved 6 visits total)   PLANNED INTERVENTIONS: Therapeutic exercises, Therapeutic activity, Neuro Muscular re-education, Balance training, Gait training, Patient/Family education, Joint mobilization, Stair training, DME instructions, Dry Needling, Electrical stimulation, Cryotherapy, Moist heat, Taping, Ultrasound, Ionotophoresis 44m/ml Dexamethasone, and Manual therapy.  All included unless contraindicated   PLAN FOR NEXT SESSION: End of approved visits.  Await additional information.   MScot Jun PT, DPT, OCS, ATC 05/05/22  10:56 AM   PHYSICAL THERAPY DISCHARGE SUMMARY  Visits from Start of Care: 6  Current functional level related to goals / functional outcomes: See note   Remaining deficits: See note   Education / Equipment: HEP  Patient goals were  mostly met . Patient is being discharged due to  end of authorized visits.  MScot Jun PT, DPT, OCS, ATC 07/22/22  3:06 PM

## 2022-05-09 DIAGNOSIS — J40 Bronchitis, not specified as acute or chronic: Secondary | ICD-10-CM | POA: Diagnosis not present

## 2022-06-05 DIAGNOSIS — Z23 Encounter for immunization: Secondary | ICD-10-CM | POA: Diagnosis not present

## 2022-06-05 DIAGNOSIS — M2141 Flat foot [pes planus] (acquired), right foot: Secondary | ICD-10-CM | POA: Diagnosis not present

## 2022-06-05 DIAGNOSIS — M2142 Flat foot [pes planus] (acquired), left foot: Secondary | ICD-10-CM | POA: Diagnosis not present

## 2022-06-05 DIAGNOSIS — R0982 Postnasal drip: Secondary | ICD-10-CM | POA: Diagnosis not present

## 2022-06-05 DIAGNOSIS — I10 Essential (primary) hypertension: Secondary | ICD-10-CM | POA: Diagnosis not present

## 2022-07-09 ENCOUNTER — Ambulatory Visit (INDEPENDENT_AMBULATORY_CARE_PROVIDER_SITE_OTHER): Payer: No Typology Code available for payment source | Admitting: Orthopedic Surgery

## 2022-07-09 DIAGNOSIS — M79671 Pain in right foot: Secondary | ICD-10-CM | POA: Diagnosis not present

## 2022-07-09 DIAGNOSIS — M79672 Pain in left foot: Secondary | ICD-10-CM | POA: Diagnosis not present

## 2022-07-10 ENCOUNTER — Encounter: Payer: Self-pay | Admitting: Orthopedic Surgery

## 2022-07-10 NOTE — Progress Notes (Signed)
Office Visit Note   Patient: Christie Davis           Date of Birth: Jan 23, 1946           MRN: [id number] Visit Date: 07/09/2022 Requested by: Harlan Stains, MD [address],  [place] [address] PCP: Harlan Stains, MD  Subjective: Chief Complaint  Patient presents with   Left Ankle - Pain    HPI: Alizah Sills is a 76 y.o. female who presents to the office reporting left ankle pain.  She sustained a left ankle fracture January 31.  The fracture went on to healing.  She does have a home exercise program prescribed by physical therapy.  Reports pain in the posterolateral aspect of the ankle.  She does use compression socks.  She takes Tylenol and it helps some..                ROS: All systems reviewed are negative as they relate to the chief complaint within the history of present illness.  Patient denies fevers or chills.  Assessment & Plan: Visit Diagnoses:  1. Bilateral foot pain     Plan: Impression is well-healed ankle fracture with no shortening of the fibula.  Peroneal tendons function well.  She does have pes planus and she may be getting some partial lateral impingement from that.  I think she would do well with molded insert and cushioned heel bilateral feet.  I will send her to podiatry for evaluation and management for that problem and she will follow-up with [place] as needed  Follow-Up Instructions: No follow-ups on file.   Orders:  Orders Placed This Encounter  Procedures   Ambulatory referral to Podiatry   No orders of the defined types were placed in this encounter.     Procedures: No procedures performed   Clinical Data: No additional findings.  Objective: Vital Signs: There were no vitals taken for this visit.  Physical Exam:  Constitutional: Patient appears well-developed HEENT:  Head: Normocephalic Eyes:EOM are normal Neck: Normal range of motion Cardiovascular: Normal rate Pulmonary/chest: Effort  normal Neurologic: Patient is alert Skin: Skin is warm Psychiatric: Patient has normal mood and affect  Ortho Exam: Ortho exam demonstrates normal gait alignment.  She does have pes planus with some collapse of the midfoot more on the left than the right.  She has palpable intact nontender anterior to posterior to peroneal and Achilles tendons.  Tibiotalar subtalar transverse tarsal range of motion intact and symmetric bilaterally.  Peroneal function and strength excellent on the left with eversion testing.  Sensation intact on the dorsal and plantar aspect of the foot.  Appropriate heel inversion when she stands on her toes.  Slightly less however on the left compared to the right  Specialty Comments:  No specialty comments available.  Imaging: No results found.   PMFS History: There are no problems to display for this patient.  Past Medical History:  Diagnosis Date   Chronic cough    off and on-   Hypertension    Medical history non-contributory    Wears glasses     History reviewed. No pertinent family history.  Past Surgical History:  Procedure Laterality Date   COLONOSCOPY     ORIF HUMERUS FRACTURE Right 12/06/2012   Procedure: OPEN REDUCTION INTERNAL FIXATION (ORIF) PROXIMAL HUMERUS FRACTURE;  Surgeon: Nita Sells, MD;  Location: [facility];  Service: Orthopedics;  Laterality: Right;   RECTAL TUMOR BY PROCTOTOMY EXCISION  2007  TUBAL LIGATION     Social History   Occupational History   Not on file  Tobacco Use   Smoking status: Never   Smokeless tobacco: Never  Substance and Sexual Activity   Alcohol use: No   Drug use: No   Sexual activity: Not on file

## 2022-07-18 ENCOUNTER — Encounter: Payer: Self-pay | Admitting: Podiatry

## 2022-07-18 ENCOUNTER — Ambulatory Visit (INDEPENDENT_AMBULATORY_CARE_PROVIDER_SITE_OTHER): Payer: Medicare HMO

## 2022-07-18 ENCOUNTER — Ambulatory Visit: Payer: Medicare HMO | Admitting: Podiatry

## 2022-07-18 DIAGNOSIS — M778 Other enthesopathies, not elsewhere classified: Secondary | ICD-10-CM | POA: Diagnosis not present

## 2022-07-18 DIAGNOSIS — Q666 Other congenital valgus deformities of feet: Secondary | ICD-10-CM

## 2022-07-18 DIAGNOSIS — M7752 Other enthesopathy of left foot: Secondary | ICD-10-CM | POA: Diagnosis not present

## 2022-07-18 NOTE — Patient Instructions (Signed)
You can use voltaren gel  For shoes I would recommend going to [facility] to get fitted.    WEARING INSTRUCTIONS FOR ORTHOTICS  Don't expect to be comfortable wearing your orthotic devices for the first time.  Like eyeglasses, you may be aware of them as time passes, they will not be uncomfortable and you will enjoy wearing them.  FOLLOW THESE INSTRUCTIONS EXACTLY!  Wear your orthotic devices for:       Not more than 1 hour the first day.       Not more than 2 hours the second day.       Not more than 3 hours the third day and so on.        Or wear them for as long as they feel comfortable.       If you experience discomfort in your feet or legs take them out.  When feet & legs feel       better, put them back in.  You do need to be consistent and wear them a little        everyday. 2.   If at any time the orthotic devices become acutely uncomfortable before the       time for that particular day, STOP WEARING THEM. 3.   On the next day, do not increase the wearing time. 4.   Subsequently, increase the wearing time by 15-30 minutes only if comfortable to do       so. 5.   You will be seen by your doctor about 2-4 weeks after you receive your orthotic       devices, at which time you will probably be wearing your devices comfortably        for about 8 hours or more a day. 6.   Some patients occasionally report mild aches or discomfort in other parts of the of       body such as the knees, hips or back after 3 or 4 consecutive hours of wear.  If this       is the case with you, do not extend your wearing time.  Instead, cut it back an hour or       two.  In all likelihood, these symptoms will disappear in a short period of time as your       body posture realigns itself and functions more efficiently. 7.   It is possible that your orthotic device may require some small changes or adjustment       to improve their function or make them more comfortable.   This is usually not done        before one to three months have elapsed.  These adjustments are made in        accordance with the changed position your feet are assuming as a result of       improved biomechanical function. 8.   In women's shoes, it's not unusual for your heel to slip out of the shoe, particularly if       they are step-in-shoes.  If this is the case, try other shoes or other styles.  Try to       purchase shoes which have deeper heal seats or higher heel counters. 9.   Squeaking of orthotics devices in the shoes is due to the movement of the devices       when they are functioning normally.  To eliminate squeaking, simply dust some  baby powder into your shoes before inserting the devices.  If this does not work,        apply soap or wax to the edges of the orthotic devices or put a tissue into the shoes. 10. It is important that you follow these directions explicitly.  Failure to do so will simply       prolong the adjustment period or create problems which are easily avoided.  It makes       no difference if you are wearing your orthotic devices for only a few hours after        several months, so long as you are wearing them comfortably for those hours. 11. If you have any questions or complaints, contact our office.  We have no way of       knowing about your problems unless you tell us.  If we do not hear from you, we will       assume that you are proceeding well.

## 2022-07-18 NOTE — Progress Notes (Unsigned)
   Subjective:   Patient ID: Christie Davis, female   DOB: 76 y.o.   MRN: [id number]   HPI Chief Complaint  Patient presents with   Ankle Pain    Plantar feet bilateral - flat feet - previous fracture lateral right ankle, been seeing Dr. Marlou Sa, completed PT, also thought she had some arthritis dorsal midfoot right, referred for orthotics   New Patient (Initial Visit)   76 year old female presents the above concerns.  She states that she has flatfeet she previously had a fracture of the ankle.  Since then she been having discomfort she was referred to [place] for orthotics.  She wants to proceed with these.  She is still in the care of Dr. Marlou Sa for her ankle.   Review of Systems  All other systems reviewed and are negative.  Past Medical History:  Diagnosis Date   Chronic cough    off and on-   Hypertension    Medical history non-contributory    Wears glasses     Past Surgical History:  Procedure Laterality Date   COLONOSCOPY     ORIF HUMERUS FRACTURE Right 12/06/2012   Procedure: OPEN REDUCTION INTERNAL FIXATION (ORIF) PROXIMAL HUMERUS FRACTURE;  Surgeon: Nita Sells, MD;  Location: [facility];  Service: Orthopedics;  Laterality: Right;   RECTAL TUMOR BY PROCTOTOMY EXCISION  2007   TUBAL LIGATION       Current Outpatient Medications:    amLODipine (NORVASC) 10 MG tablet, Take 10 mg by mouth daily., Disp: , Rfl:    latanoprost (XALATAN) 0.005 % ophthalmic solution, 1 drop at bedtime., Disp: , Rfl:    Vitamin D, Ergocalciferol, (DRISDOL) 50000 UNITS CAPS, Take 50,000 Units by mouth every 7 (seven) days. Take on Mondays, Disp: , Rfl:   No Known Allergies         Objective:  Physical Exam  General: AAO x3, NAD  Dermatological: Skin is warm, dry and supple bilateral.  There are no open sores, no preulcerative lesions, no rash or signs of infection present.  Vascular: Dorsalis Pedis artery and Posterior Tibial artery pedal pulses are 2/4  bilateral with immedate capillary fill time. There is no pain with calf compression, swelling, warmth, erythema.   Neruologic: Grossly intact via light touch bilateral.   Musculoskeletal: Decreased medial arch upon weightbearing.  Ankle, surgical intervention intact.  Tenderness palpation of the sinus tarsi on the course the peroneal tendon.  Clinically the flexor, extensor tendons appear intact.  MMT 5/5.  Gait: Unassisted, Nonantalgic.       Assessment:   76 year old female with flatfoot, tendonitis/capsulitis ankle     Plan:  -Treatment options discussed including all alternatives, risks, and complications -Etiology of symptoms were discussed -X-rays were obtained and reviewed with the patient.  3 views of the feet were obtained.  Increased talonavicular joint uncoverage.  Decreased calcaneal inclination angle.  Calcaneal spurring is noted. -Continues to benefit from orthotics.  She was measured for custom inserts today and she wants to proceed with these.  In the meantime she is to use power steps and so she get the custom inserts.  Discussed Voltaren gel for now.  Discussed shoe modifications.  Trula Slade DPM     Powersteps and orthotics  [employer]

## 2022-08-18 ENCOUNTER — Ambulatory Visit: Payer: Medicare HMO | Admitting: *Deleted

## 2022-08-18 DIAGNOSIS — Q666 Other congenital valgus deformities of feet: Secondary | ICD-10-CM

## 2022-08-22 NOTE — Progress Notes (Signed)
Patient presents today to pick up custom molded foot orthotics recommended by Dr. Jacqualyn Posey.   Orthotics were dispensed and fit was satisfactory. Reviewed instructions for break-in and wear. Written instructions given to patient.  Patient will follow up as needed.   [id number]

## 2022-08-25 ENCOUNTER — Other Ambulatory Visit: Payer: Medicare HMO

## 2022-09-05 ENCOUNTER — Telehealth: Payer: Self-pay | Admitting: Orthopedic Surgery

## 2022-09-05 NOTE — Telephone Encounter (Signed)
Patient called advised she will need a rating for her work comp case. Patient asked if she need to come into the office for the rating? The number to contact patient is [phone]

## 2022-10-03 ENCOUNTER — Ambulatory Visit: Payer: No Typology Code available for payment source | Admitting: Orthopedic Surgery

## 2022-10-03 ENCOUNTER — Telehealth: Payer: Self-pay | Admitting: Orthopedic Surgery

## 2022-10-03 DIAGNOSIS — H2513 Age-related nuclear cataract, bilateral: Secondary | ICD-10-CM | POA: Diagnosis not present

## 2022-10-03 DIAGNOSIS — H401122 Primary open-angle glaucoma, left eye, moderate stage: Secondary | ICD-10-CM | POA: Diagnosis not present

## 2022-10-03 DIAGNOSIS — H524 Presbyopia: Secondary | ICD-10-CM | POA: Diagnosis not present

## 2022-10-03 DIAGNOSIS — H43813 Vitreous degeneration, bilateral: Secondary | ICD-10-CM | POA: Diagnosis not present

## 2022-10-03 DIAGNOSIS — H5213 Myopia, bilateral: Secondary | ICD-10-CM | POA: Diagnosis not present

## 2022-10-03 DIAGNOSIS — H25013 Cortical age-related cataract, bilateral: Secondary | ICD-10-CM | POA: Diagnosis not present

## 2022-10-03 DIAGNOSIS — H35373 Puckering of macula, bilateral: Secondary | ICD-10-CM | POA: Diagnosis not present

## 2022-10-08 ENCOUNTER — Ambulatory Visit: Payer: No Typology Code available for payment source | Admitting: Surgical

## 2022-10-10 ENCOUNTER — Ambulatory Visit: Payer: No Typology Code available for payment source | Admitting: Surgical

## 2022-10-23 ENCOUNTER — Ambulatory Visit: Payer: No Typology Code available for payment source | Admitting: Orthopedic Surgery

## 2022-10-23 ENCOUNTER — Encounter: Payer: Self-pay | Admitting: Orthopedic Surgery

## 2022-10-23 DIAGNOSIS — S82892A Other fracture of left lower leg, initial encounter for closed fracture: Secondary | ICD-10-CM

## 2022-10-23 DIAGNOSIS — M79672 Pain in left foot: Secondary | ICD-10-CM

## 2022-10-23 DIAGNOSIS — M79671 Pain in right foot: Secondary | ICD-10-CM | POA: Diagnosis not present

## 2022-10-23 NOTE — Progress Notes (Signed)
   Office Visit Note   Patient: Christie Davis           Date of Birth: 27-Jun-1946           MRN: [id number] Visit Date: 10/23/2022 Requested by: Harlan Stains, MD [address] [place],  [place] [address] PCP: Harlan Stains, MD  Subjective: Chief Complaint  Patient presents with   Left Ankle - Follow-up    Needs rating    HPI: Christie Davis is a 77 y.o. female who presents to the office reporting left ankle pain.  Date of injury for her ankle fracture treated without surgery 10/22/2021.  She is walking around and doing reasonably well.  Has some occasional tingling around the lateral malleolar region..                ROS: All systems reviewed are negative as they relate to the chief complaint within the history of present illness.  Patient denies fevers or chills.  Assessment & Plan: Visit Diagnoses:  1. Closed fracture of left ankle, initial encounter   2. Bilateral foot pain     Plan: Impression is left ankle healed lateral malleolar fracture.  Patient does have pes planus.  She has reached maximal medical improvement.  She is rated and released at this time at 5% permanent partial disability of the ankle based on guidelines from [facility] guides to the evaluation of permanent impairment page 503.Marland Kitchen  She will follow-up as needed.  Follow-Up Instructions: No follow-ups on file.   Orders:  No orders of the defined types were placed in this encounter.  No orders of the defined types were placed in this encounter.     Procedures: No procedures performed   Clinical Data: No additional findings.  Objective: Vital Signs: There were no vitals taken for this visit.  Physical Exam:  Constitutional: Patient appears well-developed HEENT:  Head: Normocephalic Eyes:EOM are normal Neck: Normal range of motion Cardiovascular: Normal rate Pulmonary/chest: Effort normal Neurologic: Patient is alert Skin: Skin is warm Psychiatric: Patient has normal mood  and affect  Ortho Exam: Ortho exam demonstrates normal gait and alignment.  Pes planus bilateral is present.  She has palpable intact nontender anterior to posterior to peroneal and Achilles tendons.  Slight tenderness around the lateral malleolus.  Tibiotalar subtalar transverse tarsal range of motion is symmetric between ankles.  Specialty Comments:  No specialty comments available.  Imaging: No results found.   PMFS History: There are no problems to display for this patient.  Past Medical History:  Diagnosis Date   Chronic cough    off and on-   Hypertension    Medical history non-contributory    Wears glasses     No family history on file.  Past Surgical History:  Procedure Laterality Date   COLONOSCOPY     ORIF HUMERUS FRACTURE Right 12/06/2012   Procedure: OPEN REDUCTION INTERNAL FIXATION (ORIF) PROXIMAL HUMERUS FRACTURE;  Surgeon: Nita Sells, MD;  Location: [facility];  Service: Orthopedics;  Laterality: Right;   RECTAL TUMOR BY PROCTOTOMY EXCISION  2007   TUBAL LIGATION     Social History   Occupational History   Not on file  Tobacco Use   Smoking status: Never   Smokeless tobacco: Never  Substance and Sexual Activity   Alcohol use: No   Drug use: No   Sexual activity: Not on file

## 2022-11-11 ENCOUNTER — Telehealth: Payer: Self-pay | Admitting: Orthopedic Surgery

## 2022-11-11 NOTE — Telephone Encounter (Signed)
Patient waiting on a rating report and has not gotten any report. Please advise

## 2022-11-11 NOTE — Telephone Encounter (Signed)
Holding for Lauren to discuss with Dr. Marlou Sa.

## 2022-11-14 DIAGNOSIS — E559 Vitamin D deficiency, unspecified: Secondary | ICD-10-CM | POA: Diagnosis not present

## 2022-11-14 DIAGNOSIS — I10 Essential (primary) hypertension: Secondary | ICD-10-CM | POA: Diagnosis not present

## 2022-11-14 DIAGNOSIS — R7303 Prediabetes: Secondary | ICD-10-CM | POA: Diagnosis not present

## 2022-11-14 DIAGNOSIS — E785 Hyperlipidemia, unspecified: Secondary | ICD-10-CM | POA: Diagnosis not present

## 2022-11-14 DIAGNOSIS — M8588 Other specified disorders of bone density and structure, other site: Secondary | ICD-10-CM | POA: Diagnosis not present

## 2022-11-14 DIAGNOSIS — H6123 Impacted cerumen, bilateral: Secondary | ICD-10-CM | POA: Diagnosis not present

## 2022-11-14 DIAGNOSIS — E6609 Other obesity due to excess calories: Secondary | ICD-10-CM | POA: Diagnosis not present

## 2022-11-14 DIAGNOSIS — Z0000 Encounter for general adult medical examination without abnormal findings: Secondary | ICD-10-CM | POA: Diagnosis not present

## 2022-11-18 ENCOUNTER — Telehealth: Payer: Self-pay | Admitting: Orthopedic Surgery

## 2022-11-18 NOTE — Telephone Encounter (Signed)
Received call from patient. She is requesting copy of the 10/23/22 note. She stated she is to be rated. Note needs to be signed. Also, Note states she is rated and released but rating is not reflecting in the note. Patients callback [phone]

## 2022-11-24 NOTE — Telephone Encounter (Signed)
IC patient, advised that the ov note is complete with rating. Hard copy is ready for her to pick up. And she also can view via MyChart. Patient voiced understanding.

## 2022-11-24 NOTE — Telephone Encounter (Signed)
Done thx

## 2022-11-24 NOTE — Telephone Encounter (Signed)
FYI. Note done.

## 2022-12-08 DIAGNOSIS — I10 Essential (primary) hypertension: Secondary | ICD-10-CM | POA: Diagnosis not present

## 2022-12-08 DIAGNOSIS — E669 Obesity, unspecified: Secondary | ICD-10-CM | POA: Diagnosis not present

## 2022-12-08 DIAGNOSIS — Z8249 Family history of ischemic heart disease and other diseases of the circulatory system: Secondary | ICD-10-CM | POA: Diagnosis not present

## 2022-12-08 DIAGNOSIS — H259 Unspecified age-related cataract: Secondary | ICD-10-CM | POA: Diagnosis not present

## 2022-12-08 DIAGNOSIS — H409 Unspecified glaucoma: Secondary | ICD-10-CM | POA: Diagnosis not present

## 2022-12-08 DIAGNOSIS — M1990 Unspecified osteoarthritis, unspecified site: Secondary | ICD-10-CM | POA: Diagnosis not present

## 2022-12-08 DIAGNOSIS — Z809 Family history of malignant neoplasm, unspecified: Secondary | ICD-10-CM | POA: Diagnosis not present

## 2022-12-08 DIAGNOSIS — Z9181 History of falling: Secondary | ICD-10-CM | POA: Diagnosis not present

## 2022-12-08 DIAGNOSIS — Z833 Family history of diabetes mellitus: Secondary | ICD-10-CM | POA: Diagnosis not present

## 2022-12-08 DIAGNOSIS — Z823 Family history of stroke: Secondary | ICD-10-CM | POA: Diagnosis not present

## 2022-12-08 DIAGNOSIS — E559 Vitamin D deficiency, unspecified: Secondary | ICD-10-CM | POA: Diagnosis not present

## 2022-12-08 DIAGNOSIS — Z6833 Body mass index (BMI) 33.0-33.9, adult: Secondary | ICD-10-CM | POA: Diagnosis not present

## 2023-01-02 DIAGNOSIS — E785 Hyperlipidemia, unspecified: Secondary | ICD-10-CM | POA: Diagnosis not present

## 2023-02-27 DIAGNOSIS — U071 COVID-19: Secondary | ICD-10-CM | POA: Diagnosis not present

## 2023-03-30 DIAGNOSIS — H401122 Primary open-angle glaucoma, left eye, moderate stage: Secondary | ICD-10-CM | POA: Diagnosis not present

## 2023-05-07 DIAGNOSIS — I10 Essential (primary) hypertension: Secondary | ICD-10-CM | POA: Diagnosis not present

## 2023-05-07 DIAGNOSIS — H6121 Impacted cerumen, right ear: Secondary | ICD-10-CM | POA: Diagnosis not present

## 2023-05-07 DIAGNOSIS — Z7184 Encounter for health counseling related to travel: Secondary | ICD-10-CM | POA: Diagnosis not present

## 2023-05-07 DIAGNOSIS — R0989 Other specified symptoms and signs involving the circulatory and respiratory systems: Secondary | ICD-10-CM | POA: Diagnosis not present

## 2023-05-07 DIAGNOSIS — E6609 Other obesity due to excess calories: Secondary | ICD-10-CM | POA: Diagnosis not present

## 2023-05-07 DIAGNOSIS — Z23 Encounter for immunization: Secondary | ICD-10-CM | POA: Diagnosis not present

## 2023-07-01 DIAGNOSIS — Z23 Encounter for immunization: Secondary | ICD-10-CM | POA: Diagnosis not present

## 2023-07-24 DIAGNOSIS — R69 Illness, unspecified: Secondary | ICD-10-CM | POA: Diagnosis not present

## 2023-10-05 DIAGNOSIS — H25812 Combined forms of age-related cataract, left eye: Secondary | ICD-10-CM | POA: Diagnosis not present

## 2023-10-05 DIAGNOSIS — Z961 Presence of intraocular lens: Secondary | ICD-10-CM | POA: Diagnosis not present

## 2023-10-05 DIAGNOSIS — H2512 Age-related nuclear cataract, left eye: Secondary | ICD-10-CM | POA: Diagnosis not present

## 2023-10-05 DIAGNOSIS — H25012 Cortical age-related cataract, left eye: Secondary | ICD-10-CM | POA: Diagnosis not present

## 2023-10-19 DIAGNOSIS — H25011 Cortical age-related cataract, right eye: Secondary | ICD-10-CM | POA: Diagnosis not present

## 2023-10-19 DIAGNOSIS — Z961 Presence of intraocular lens: Secondary | ICD-10-CM | POA: Diagnosis not present

## 2023-10-19 DIAGNOSIS — H2511 Age-related nuclear cataract, right eye: Secondary | ICD-10-CM | POA: Diagnosis not present

## 2023-10-19 DIAGNOSIS — H25811 Combined forms of age-related cataract, right eye: Secondary | ICD-10-CM | POA: Diagnosis not present

## 2023-11-01 IMAGING — DX DG ANKLE COMPLETE 3+V*L*
3 series · 3 of 3 positions shown · non-contrast
Comparison: None.

CLINICAL DATA: Fall in parking lot.

EXAM:
LEFT ANKLE COMPLETE - 3+ VIEW

[ankle ap]
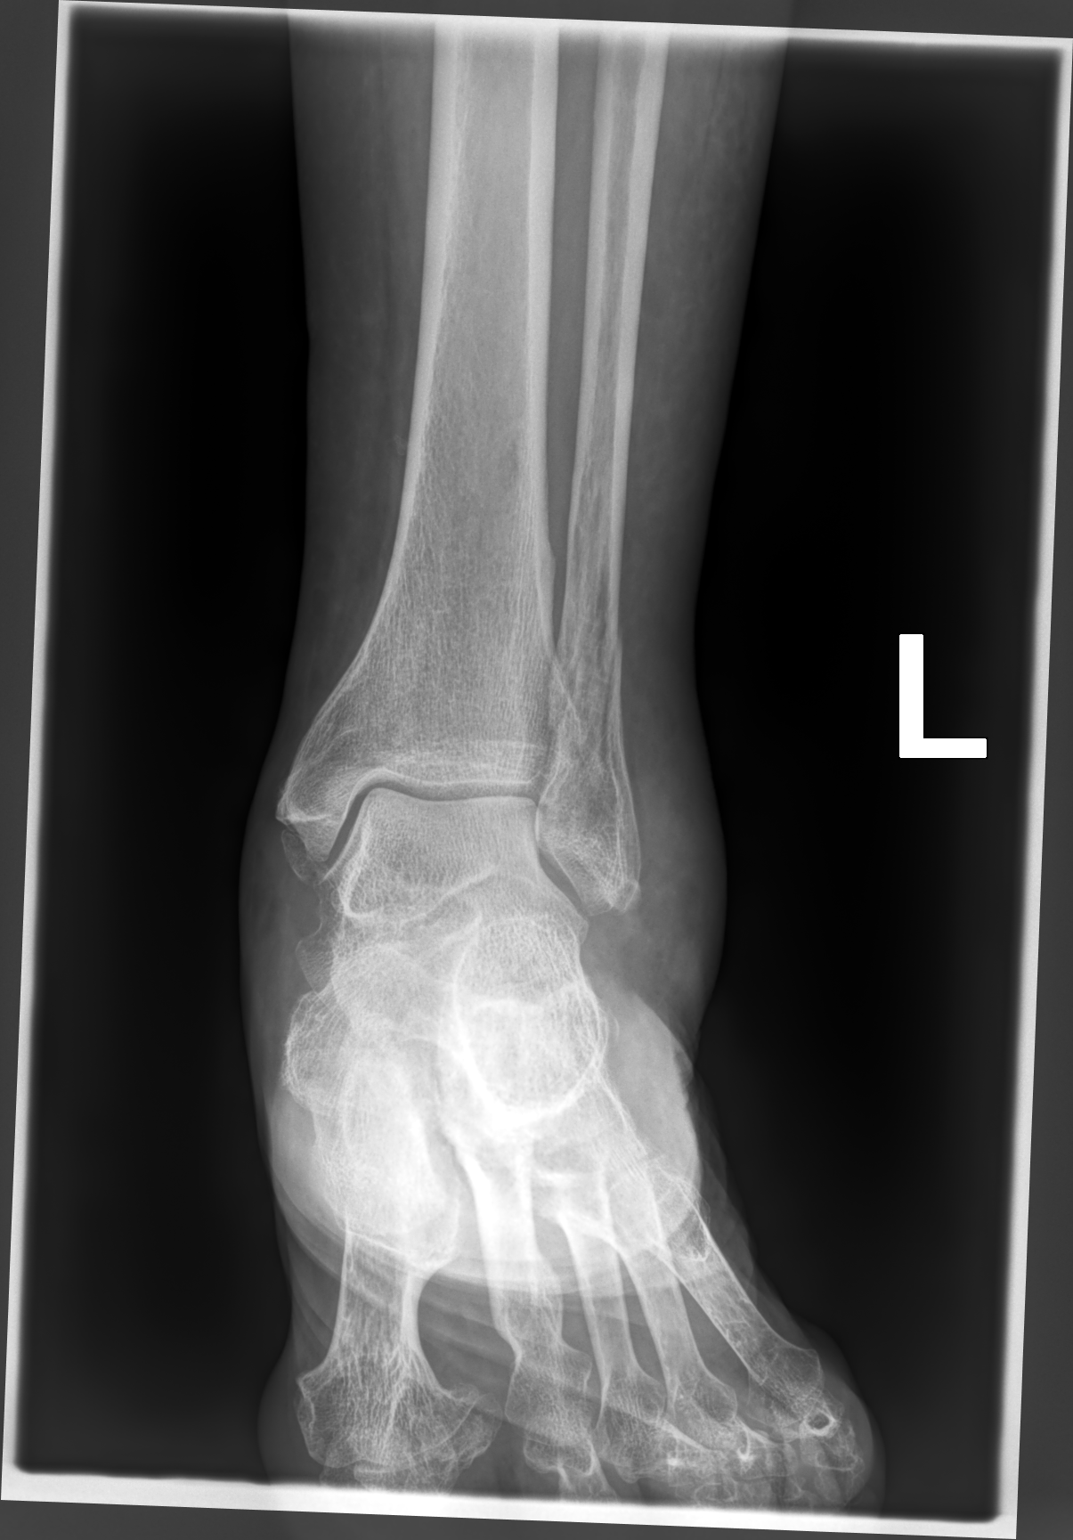

[ankle medial oblique]
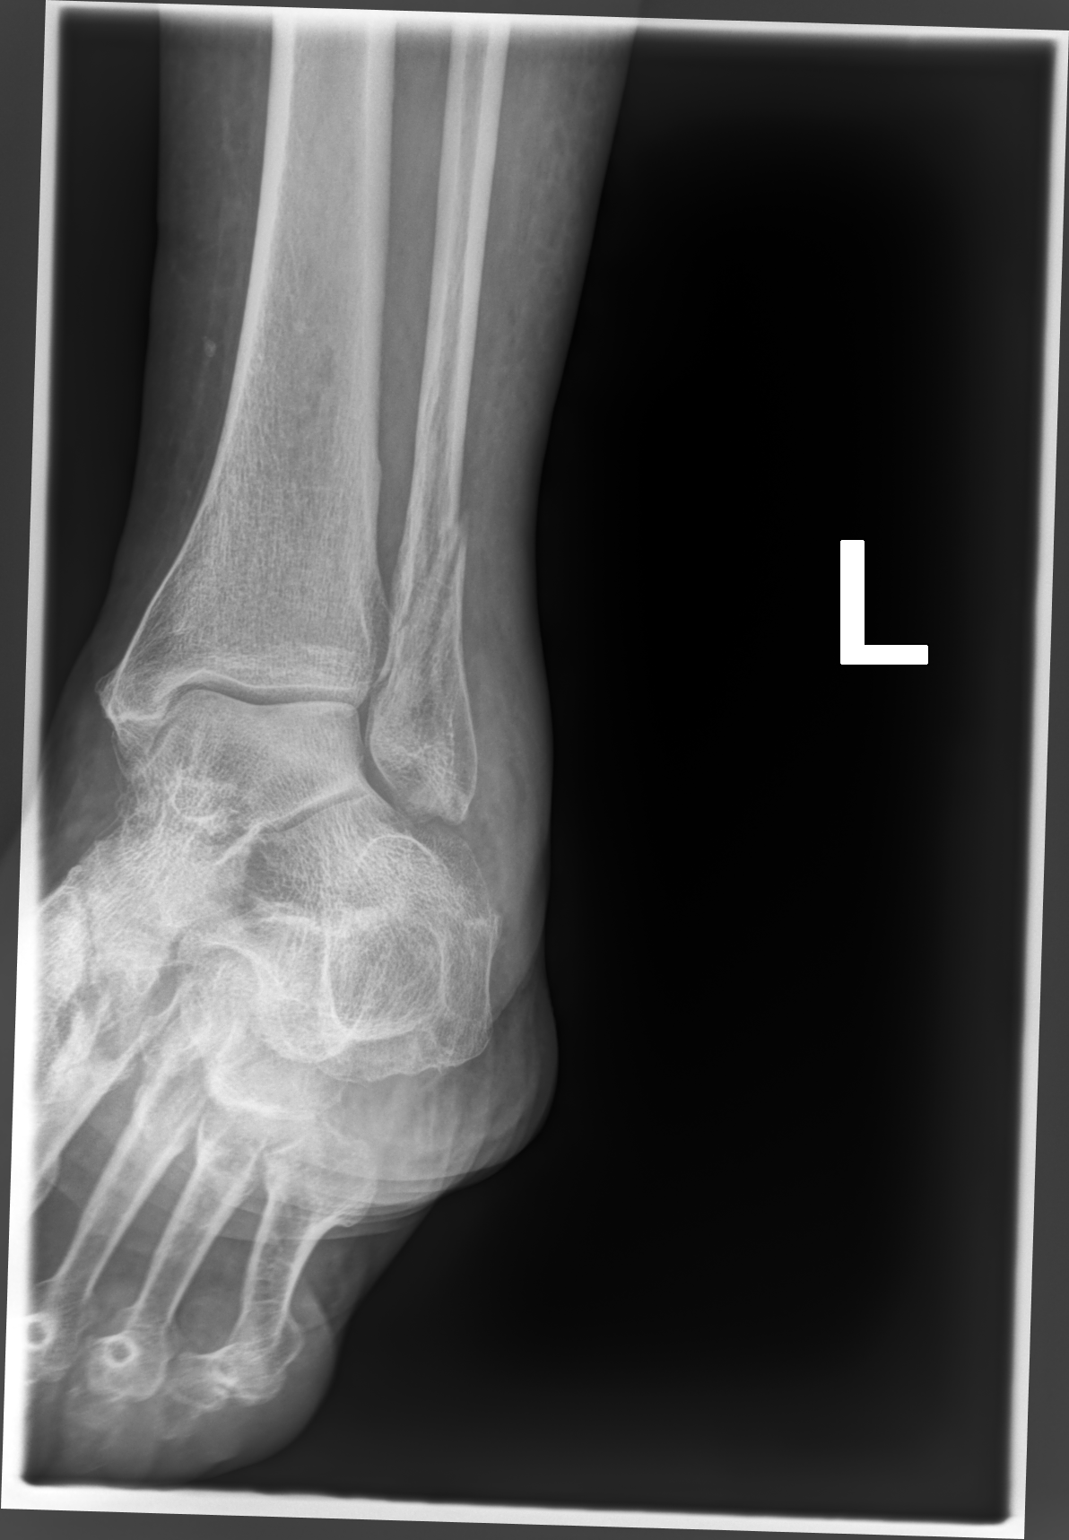

[ankle lat]
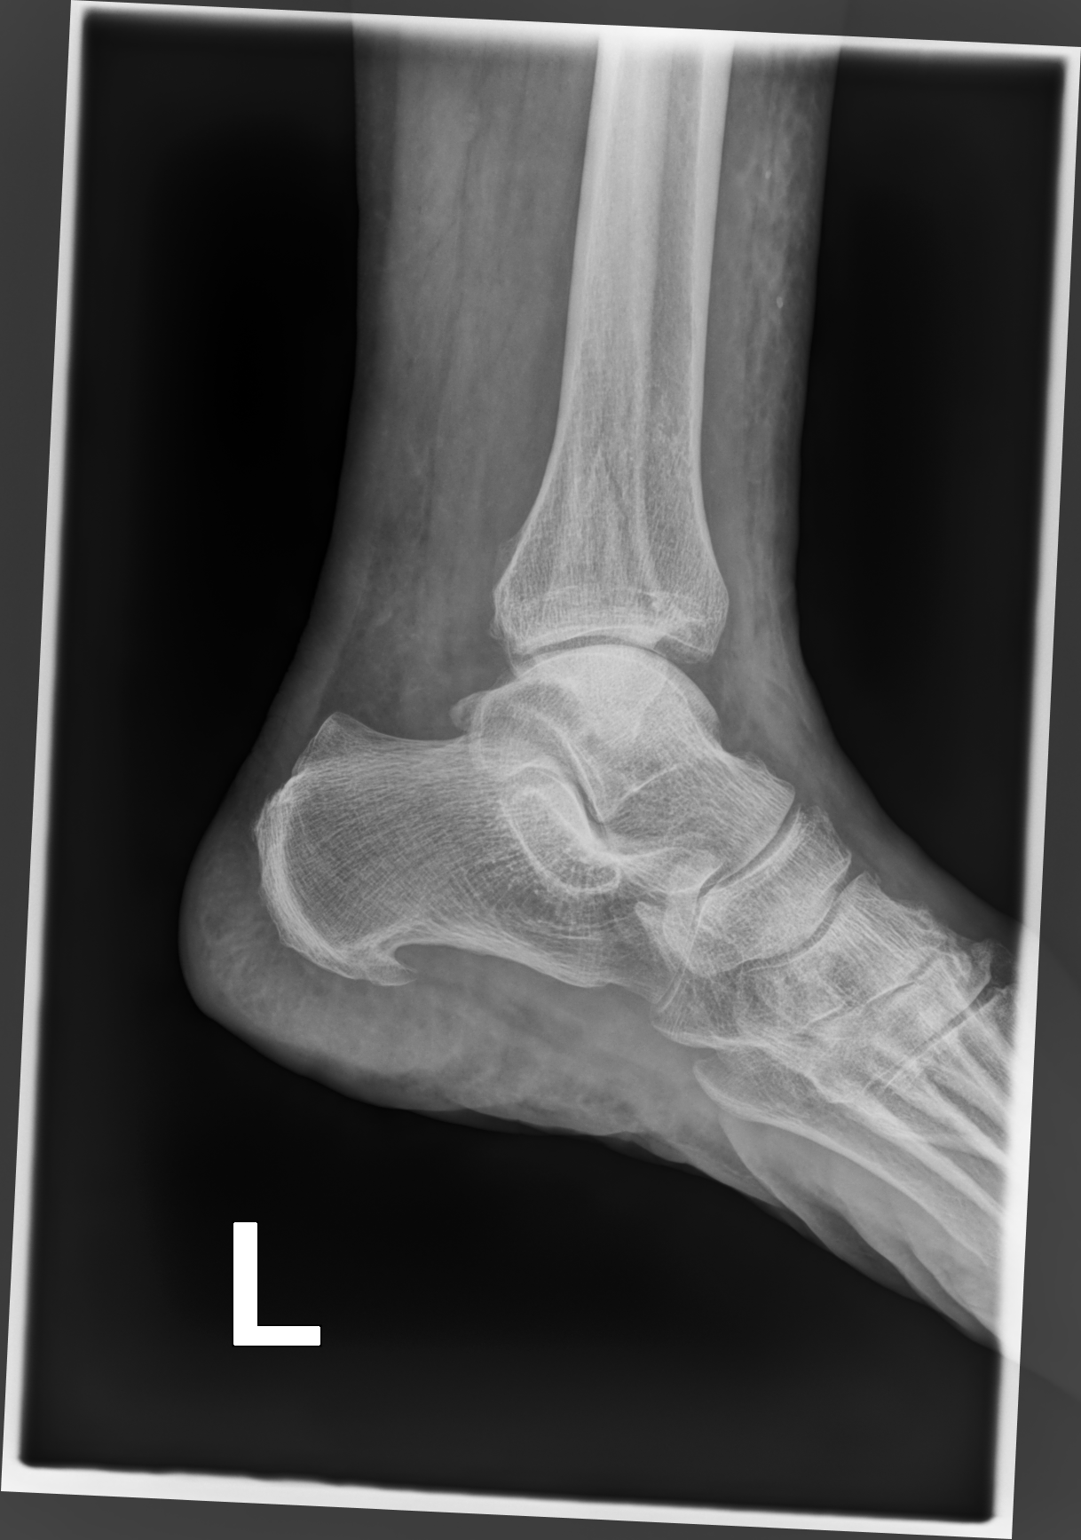

[3 of 3 positions shown; findings below may reference images not displayed]

FINDINGS: There is an acute, obliquely oriented, intra-articular fracture
deformity involving the distal fibula. There is a well corticated os
ossific density adjacent to the medial malleolus which may represent
sequelae of remote trauma or ossicle. No signs of dislocation.
Plantar heel spur identified. Lateral soft tissue swelling.
IMPRESSION: 1. Acute, obliquely oriented, intra-articular fracture deformity
involving the distal fibula.
2. Plantar heel spur.

## 2023-12-10 ENCOUNTER — Other Ambulatory Visit: Payer: Self-pay | Admitting: Family Medicine

## 2023-12-10 DIAGNOSIS — M8580 Other specified disorders of bone density and structure, unspecified site: Secondary | ICD-10-CM

## 2024-03-21 DIAGNOSIS — E785 Hyperlipidemia, unspecified: Secondary | ICD-10-CM | POA: Diagnosis not present

## 2024-03-21 DIAGNOSIS — E6609 Other obesity due to excess calories: Secondary | ICD-10-CM | POA: Diagnosis not present

## 2024-03-21 DIAGNOSIS — I10 Essential (primary) hypertension: Secondary | ICD-10-CM | POA: Diagnosis not present

## 2024-04-06 DIAGNOSIS — H401122 Primary open-angle glaucoma, left eye, moderate stage: Secondary | ICD-10-CM | POA: Diagnosis not present

## 2024-04-18 DIAGNOSIS — L303 Infective dermatitis: Secondary | ICD-10-CM | POA: Diagnosis not present

## 2024-04-21 DIAGNOSIS — I10 Essential (primary) hypertension: Secondary | ICD-10-CM | POA: Diagnosis not present

## 2024-04-21 DIAGNOSIS — E6609 Other obesity due to excess calories: Secondary | ICD-10-CM | POA: Diagnosis not present

## 2024-04-21 DIAGNOSIS — E785 Hyperlipidemia, unspecified: Secondary | ICD-10-CM | POA: Diagnosis not present

## 2024-05-22 DIAGNOSIS — I10 Essential (primary) hypertension: Secondary | ICD-10-CM | POA: Diagnosis not present

## 2024-05-22 DIAGNOSIS — E6609 Other obesity due to excess calories: Secondary | ICD-10-CM | POA: Diagnosis not present

## 2024-05-22 DIAGNOSIS — E785 Hyperlipidemia, unspecified: Secondary | ICD-10-CM | POA: Diagnosis not present

## 2024-06-13 DIAGNOSIS — L300 Nummular dermatitis: Secondary | ICD-10-CM | POA: Diagnosis not present

## 2024-06-13 DIAGNOSIS — I10 Essential (primary) hypertension: Secondary | ICD-10-CM | POA: Diagnosis not present

## 2024-06-13 DIAGNOSIS — E669 Obesity, unspecified: Secondary | ICD-10-CM | POA: Diagnosis not present

## 2024-06-13 DIAGNOSIS — Z23 Encounter for immunization: Secondary | ICD-10-CM | POA: Diagnosis not present

## 2024-06-17 DIAGNOSIS — L300 Nummular dermatitis: Secondary | ICD-10-CM | POA: Diagnosis not present

## 2024-06-21 DIAGNOSIS — I10 Essential (primary) hypertension: Secondary | ICD-10-CM | POA: Diagnosis not present

## 2024-06-21 DIAGNOSIS — E6609 Other obesity due to excess calories: Secondary | ICD-10-CM | POA: Diagnosis not present

## 2024-06-21 DIAGNOSIS — E785 Hyperlipidemia, unspecified: Secondary | ICD-10-CM | POA: Diagnosis not present

## 2024-07-19 DIAGNOSIS — Z23 Encounter for immunization: Secondary | ICD-10-CM | POA: Diagnosis not present

## 2024-07-22 DIAGNOSIS — E6609 Other obesity due to excess calories: Secondary | ICD-10-CM | POA: Diagnosis not present

## 2024-07-22 DIAGNOSIS — I10 Essential (primary) hypertension: Secondary | ICD-10-CM | POA: Diagnosis not present

## 2024-07-22 DIAGNOSIS — E785 Hyperlipidemia, unspecified: Secondary | ICD-10-CM | POA: Diagnosis not present

## 2024-08-09 ENCOUNTER — Other Ambulatory Visit

## 2024-08-17 DIAGNOSIS — H26491 Other secondary cataract, right eye: Secondary | ICD-10-CM | POA: Diagnosis not present

## 2024-08-17 DIAGNOSIS — H401122 Primary open-angle glaucoma, left eye, moderate stage: Secondary | ICD-10-CM | POA: Diagnosis not present

## 2024-08-17 DIAGNOSIS — H52203 Unspecified astigmatism, bilateral: Secondary | ICD-10-CM | POA: Diagnosis not present

## 2024-08-17 DIAGNOSIS — H04123 Dry eye syndrome of bilateral lacrimal glands: Secondary | ICD-10-CM | POA: Diagnosis not present

## 2024-08-17 DIAGNOSIS — H43813 Vitreous degeneration, bilateral: Secondary | ICD-10-CM | POA: Diagnosis not present

## 2024-08-17 DIAGNOSIS — H35372 Puckering of macula, left eye: Secondary | ICD-10-CM | POA: Diagnosis not present

## 2024-08-21 DIAGNOSIS — I10 Essential (primary) hypertension: Secondary | ICD-10-CM | POA: Diagnosis not present

## 2024-08-21 DIAGNOSIS — E6609 Other obesity due to excess calories: Secondary | ICD-10-CM | POA: Diagnosis not present

## 2024-08-21 DIAGNOSIS — E785 Hyperlipidemia, unspecified: Secondary | ICD-10-CM | POA: Diagnosis not present

## 2024-08-29 DIAGNOSIS — I10 Essential (primary) hypertension: Secondary | ICD-10-CM | POA: Diagnosis not present

## 2024-08-29 DIAGNOSIS — E66811 Obesity, class 1: Secondary | ICD-10-CM | POA: Diagnosis not present

## 2024-08-29 DIAGNOSIS — Z1331 Encounter for screening for depression: Secondary | ICD-10-CM | POA: Diagnosis not present

## 2024-08-29 DIAGNOSIS — M8588 Other specified disorders of bone density and structure, other site: Secondary | ICD-10-CM | POA: Diagnosis not present

## 2024-08-29 DIAGNOSIS — Z6834 Body mass index (BMI) 34.0-34.9, adult: Secondary | ICD-10-CM | POA: Diagnosis not present

## 2024-08-29 DIAGNOSIS — E785 Hyperlipidemia, unspecified: Secondary | ICD-10-CM | POA: Diagnosis not present

## 2024-08-29 DIAGNOSIS — E8889 Other specified metabolic disorders: Secondary | ICD-10-CM | POA: Diagnosis not present

## 2024-08-29 DIAGNOSIS — R7303 Prediabetes: Secondary | ICD-10-CM | POA: Diagnosis not present

## 2024-09-07 ENCOUNTER — Inpatient Hospital Stay (HOSPITAL_BASED_OUTPATIENT_CLINIC_OR_DEPARTMENT_OTHER): Admission: RE | Admit: 2024-09-07

## 2024-09-13 DIAGNOSIS — E66811 Obesity, class 1: Secondary | ICD-10-CM | POA: Diagnosis not present

## 2024-09-13 DIAGNOSIS — I10 Essential (primary) hypertension: Secondary | ICD-10-CM | POA: Diagnosis not present

## 2024-09-13 DIAGNOSIS — E785 Hyperlipidemia, unspecified: Secondary | ICD-10-CM | POA: Diagnosis not present

## 2024-09-13 DIAGNOSIS — Z6834 Body mass index (BMI) 34.0-34.9, adult: Secondary | ICD-10-CM | POA: Diagnosis not present

## 2024-09-13 DIAGNOSIS — M8588 Other specified disorders of bone density and structure, other site: Secondary | ICD-10-CM | POA: Diagnosis not present

## 2024-09-13 DIAGNOSIS — R7303 Prediabetes: Secondary | ICD-10-CM | POA: Diagnosis not present
